# Patient Record
Sex: Female | Born: 1945
Health system: Southern US, Community
[De-identification: ages and names within clinical notes are randomized; demographics above are authoritative.]

## PROBLEM LIST (undated history)

## (undated) DIAGNOSIS — B019 Varicella without complication: Secondary | ICD-10-CM

## (undated) DIAGNOSIS — A599 Trichomoniasis, unspecified: Secondary | ICD-10-CM

## (undated) DIAGNOSIS — J45909 Unspecified asthma, uncomplicated: Secondary | ICD-10-CM

## (undated) DIAGNOSIS — I10 Essential (primary) hypertension: Secondary | ICD-10-CM

## (undated) DIAGNOSIS — M1990 Unspecified osteoarthritis, unspecified site: Secondary | ICD-10-CM

## (undated) DIAGNOSIS — T4145XA Adverse effect of unspecified anesthetic, initial encounter: Secondary | ICD-10-CM

## (undated) DIAGNOSIS — R011 Cardiac murmur, unspecified: Secondary | ICD-10-CM

## (undated) DIAGNOSIS — T7840XA Allergy, unspecified, initial encounter: Secondary | ICD-10-CM

## (undated) DIAGNOSIS — B379 Candidiasis, unspecified: Secondary | ICD-10-CM

## (undated) DIAGNOSIS — T8859XA Other complications of anesthesia, initial encounter: Secondary | ICD-10-CM

## (undated) HISTORY — PX: OTHER SURGICAL HISTORY: SHX169

## (undated) HISTORY — PX: COLONOSCOPY: SHX174

## (undated) HISTORY — PX: BUNIONECTOMY: SHX129

## (undated) HISTORY — DX: Essential (primary) hypertension: I10

## (undated) HISTORY — DX: Unspecified asthma, uncomplicated: J45.909

## (undated) HISTORY — PX: BREAST LUMPECTOMY: SHX2

## (undated) HISTORY — DX: Candidiasis, unspecified: B37.9

## (undated) HISTORY — DX: Allergy, unspecified, initial encounter: T78.40XA

## (undated) HISTORY — DX: Varicella without complication: B01.9

## (undated) HISTORY — DX: Trichomoniasis, unspecified: A59.9

---

## 2001-04-23 ENCOUNTER — Other Ambulatory Visit: Admission: RE | Admit: 2001-04-23 | Discharge: 2001-04-23 | Payer: Self-pay | Admitting: *Deleted

## 2001-12-04 ENCOUNTER — Ambulatory Visit (HOSPITAL_COMMUNITY): Admission: RE | Admit: 2001-12-04 | Discharge: 2001-12-04 | Payer: Self-pay | Admitting: Gastroenterology

## 2002-03-11 ENCOUNTER — Encounter: Admission: RE | Admit: 2002-03-11 | Discharge: 2002-06-09 | Payer: Self-pay | Admitting: Family Medicine

## 2002-05-17 ENCOUNTER — Other Ambulatory Visit: Admission: RE | Admit: 2002-05-17 | Discharge: 2002-05-17 | Payer: Self-pay | Admitting: Obstetrics and Gynecology

## 2003-07-01 ENCOUNTER — Other Ambulatory Visit: Admission: RE | Admit: 2003-07-01 | Discharge: 2003-07-01 | Payer: Self-pay | Admitting: Obstetrics and Gynecology

## 2004-07-18 ENCOUNTER — Other Ambulatory Visit: Admission: RE | Admit: 2004-07-18 | Discharge: 2004-07-18 | Payer: Self-pay | Admitting: Obstetrics and Gynecology

## 2005-09-12 ENCOUNTER — Other Ambulatory Visit: Admission: RE | Admit: 2005-09-12 | Discharge: 2005-09-12 | Payer: Self-pay | Admitting: Obstetrics and Gynecology

## 2007-06-23 ENCOUNTER — Encounter: Admission: RE | Admit: 2007-06-23 | Discharge: 2007-06-23 | Payer: Self-pay | Admitting: Orthopedic Surgery

## 2007-09-15 ENCOUNTER — Encounter: Admission: RE | Admit: 2007-09-15 | Discharge: 2007-09-15 | Payer: Self-pay | Admitting: Orthopedic Surgery

## 2007-10-21 ENCOUNTER — Observation Stay (HOSPITAL_COMMUNITY): Admission: RE | Admit: 2007-10-21 | Discharge: 2007-10-22 | Payer: Self-pay | Admitting: Orthopedic Surgery

## 2007-11-20 ENCOUNTER — Encounter: Admission: RE | Admit: 2007-11-20 | Discharge: 2007-11-20 | Payer: Self-pay | Admitting: Orthopedic Surgery

## 2007-11-26 ENCOUNTER — Encounter: Admission: RE | Admit: 2007-11-26 | Discharge: 2007-12-28 | Payer: Self-pay | Admitting: Orthopedic Surgery

## 2008-04-14 ENCOUNTER — Encounter: Admission: RE | Admit: 2008-04-14 | Discharge: 2008-04-14 | Payer: Self-pay | Admitting: Orthopedic Surgery

## 2008-06-20 ENCOUNTER — Encounter: Admission: RE | Admit: 2008-06-20 | Discharge: 2008-06-20 | Payer: Self-pay | Admitting: Family Medicine

## 2008-07-15 ENCOUNTER — Ambulatory Visit: Payer: Self-pay | Admitting: Vascular Surgery

## 2008-10-24 IMAGING — CR DG FOOT COMPLETE 3+V*L*
3 series · 3 of 3 positions shown · non-contrast
Comparison: Films of 09/15/07.

CLINICAL DATA: Bunionectomy left foot 10/22/07.  Followup. 
 LEFT FOOT ? 3 VIEW:

[view not recorded (1 of 3)]
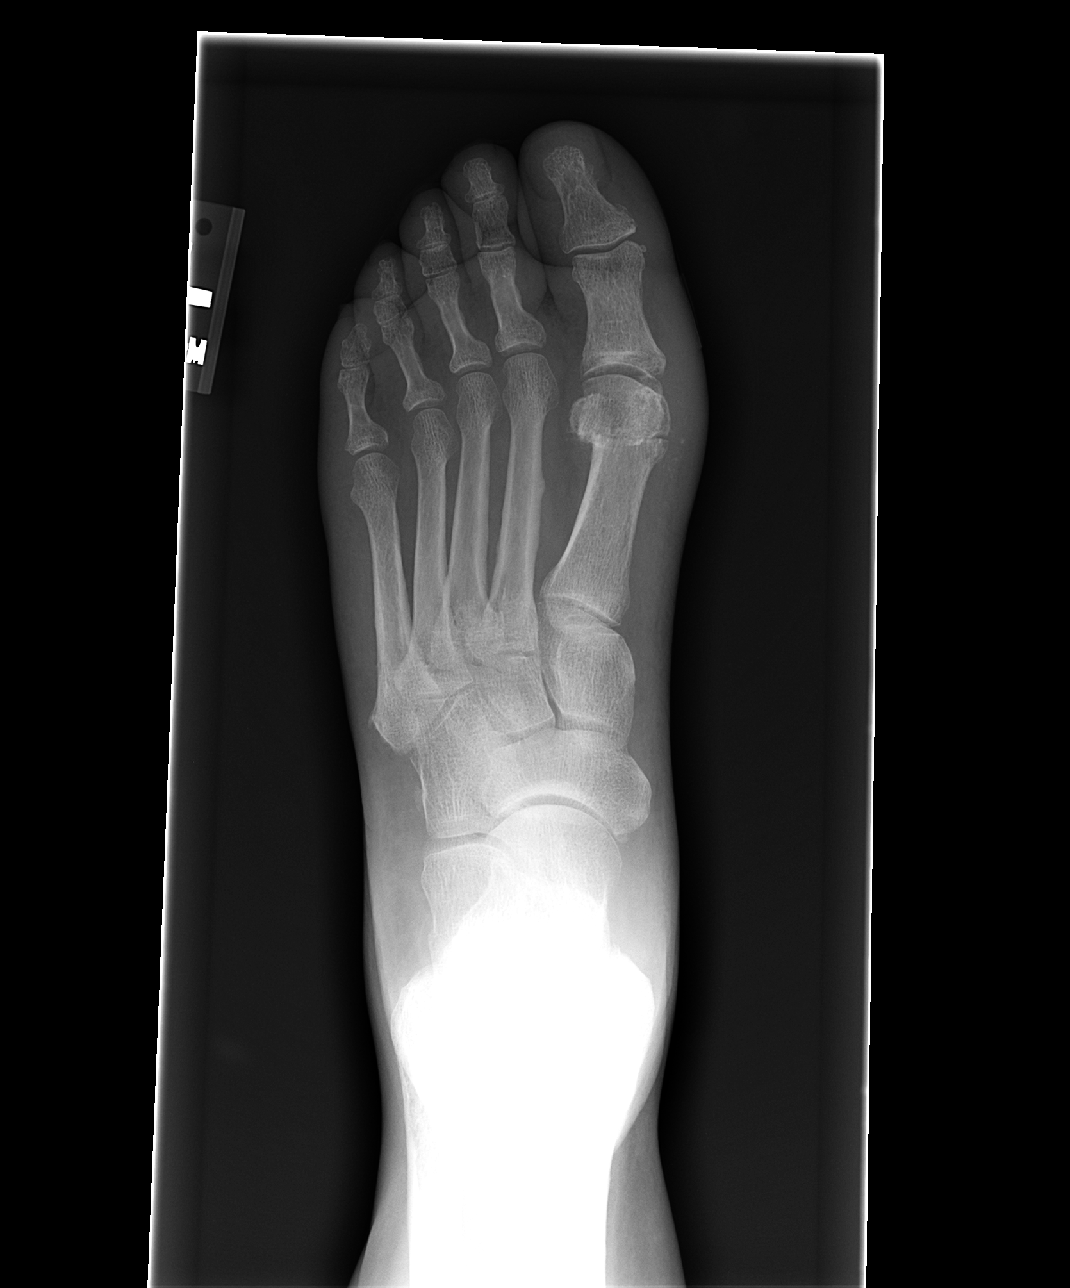

[view not recorded (2 of 3)]
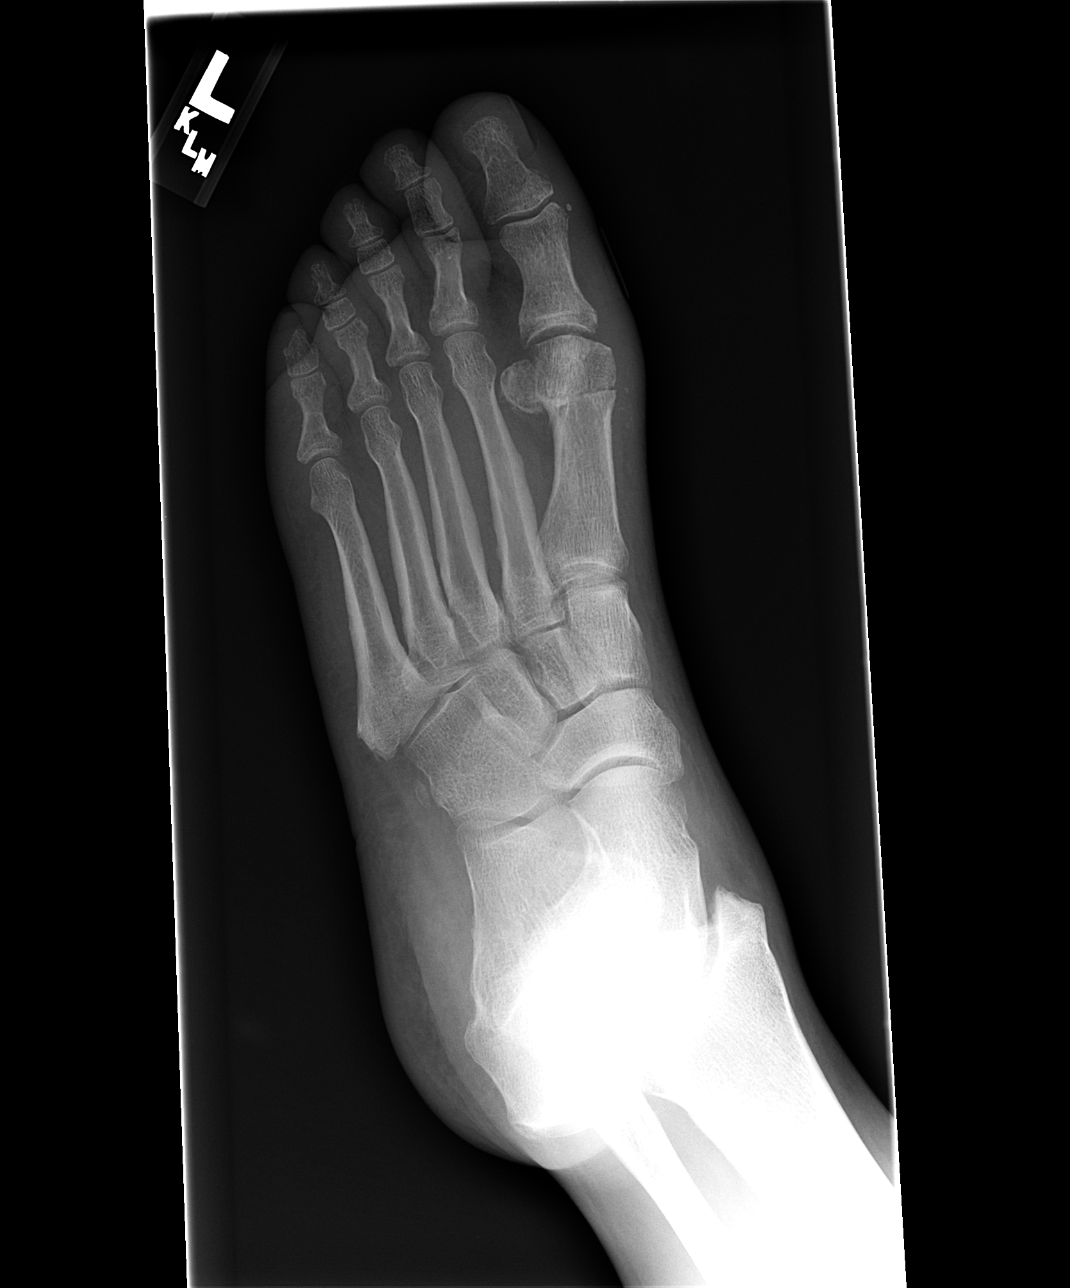

[view not recorded (3 of 3)]
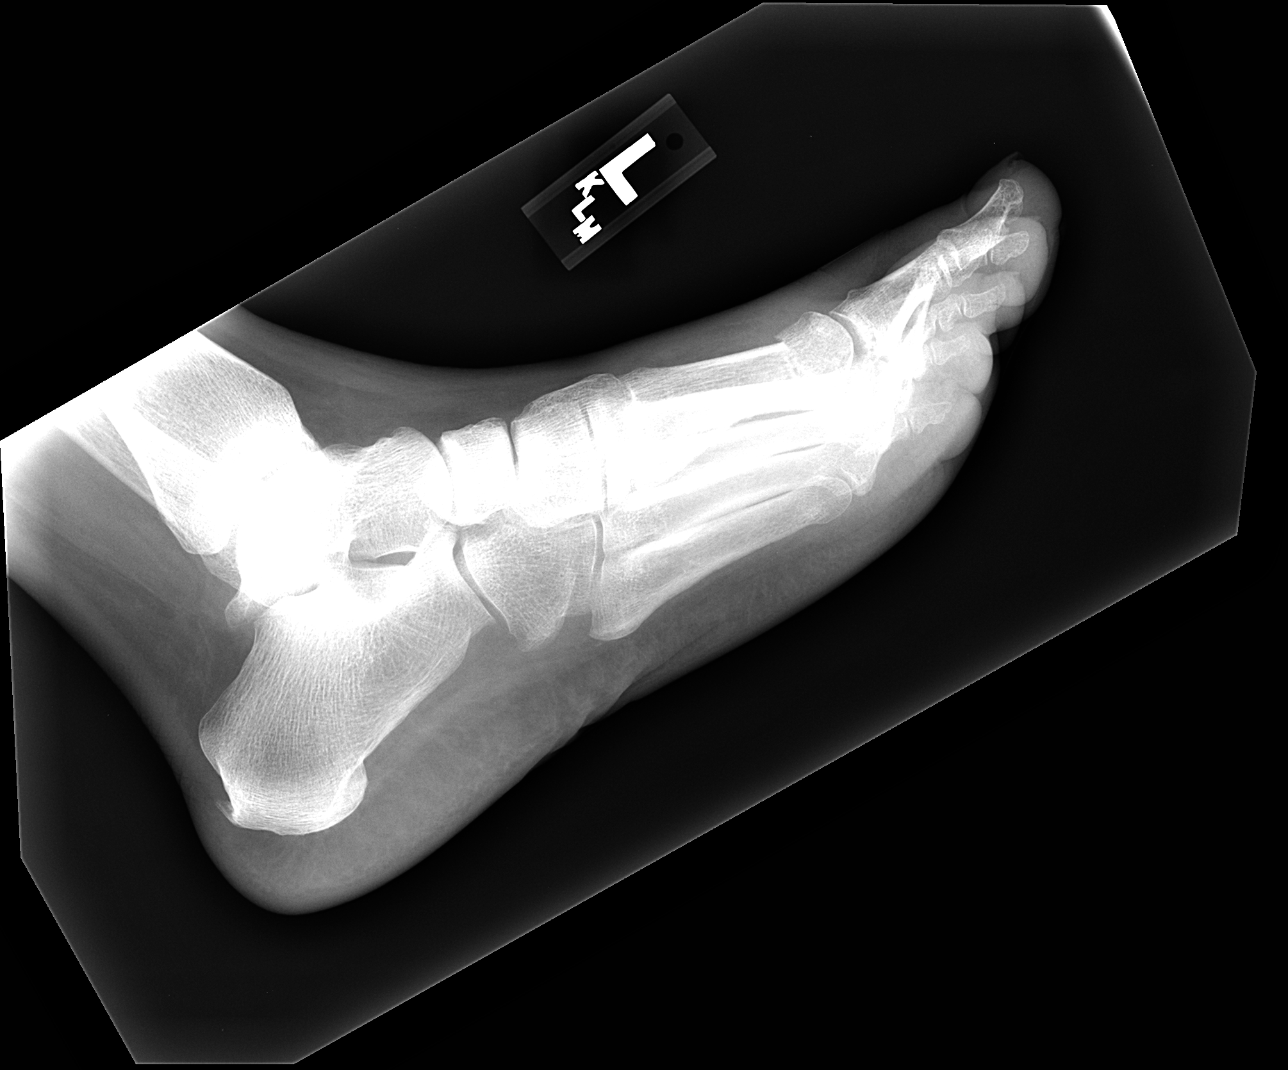

[3 of 3 positions shown; findings below may reference images not displayed]

FINDINGS: Three views of the left foot show an apparent osteotomy of the distal left 1st metatarsal.  The osteotomy line is still visualized and there is no significant callus formation.  No fixation pin is present.  On the lateral view there is some plantar tilting at the osteotomy site.  In the adjacent soft tissues medially there are small calcifications that are not seen previously and may be dystrophic.  No other abnormality is noted.
IMPRESSION: Apparent osteotomy of the distal left 1st metatarsal with some plantar tilting at the osteotomy site.  No acute abnormality.

## 2010-12-27 ENCOUNTER — Ambulatory Visit: Payer: No Typology Code available for payment source | Attending: Orthopedic Surgery | Admitting: Physical Therapy

## 2010-12-27 DIAGNOSIS — M542 Cervicalgia: Secondary | ICD-10-CM | POA: Insufficient documentation

## 2010-12-27 DIAGNOSIS — M545 Low back pain, unspecified: Secondary | ICD-10-CM | POA: Insufficient documentation

## 2010-12-27 DIAGNOSIS — IMO0001 Reserved for inherently not codable concepts without codable children: Secondary | ICD-10-CM | POA: Insufficient documentation

## 2011-01-01 ENCOUNTER — Ambulatory Visit: Payer: No Typology Code available for payment source | Admitting: Physical Therapy

## 2011-01-03 ENCOUNTER — Ambulatory Visit: Payer: No Typology Code available for payment source | Admitting: Physical Therapy

## 2011-01-04 ENCOUNTER — Ambulatory Visit: Payer: No Typology Code available for payment source | Admitting: Physical Therapy

## 2011-01-07 ENCOUNTER — Ambulatory Visit: Payer: No Typology Code available for payment source | Admitting: Physical Therapy

## 2011-01-09 ENCOUNTER — Ambulatory Visit: Payer: No Typology Code available for payment source | Admitting: Physical Therapy

## 2011-01-11 ENCOUNTER — Ambulatory Visit: Payer: No Typology Code available for payment source | Admitting: Physical Therapy

## 2011-01-15 ENCOUNTER — Ambulatory Visit: Payer: No Typology Code available for payment source | Admitting: Physical Therapy

## 2011-01-17 ENCOUNTER — Ambulatory Visit: Payer: No Typology Code available for payment source | Attending: Orthopedic Surgery | Admitting: Physical Therapy

## 2011-01-17 DIAGNOSIS — IMO0001 Reserved for inherently not codable concepts without codable children: Secondary | ICD-10-CM | POA: Insufficient documentation

## 2011-01-17 DIAGNOSIS — M542 Cervicalgia: Secondary | ICD-10-CM | POA: Insufficient documentation

## 2011-01-17 DIAGNOSIS — M545 Low back pain, unspecified: Secondary | ICD-10-CM | POA: Insufficient documentation

## 2011-01-22 ENCOUNTER — Ambulatory Visit: Payer: No Typology Code available for payment source | Admitting: Physical Therapy

## 2011-01-24 ENCOUNTER — Ambulatory Visit: Payer: No Typology Code available for payment source | Admitting: Physical Therapy

## 2011-01-29 ENCOUNTER — Ambulatory Visit: Payer: No Typology Code available for payment source | Admitting: Physical Therapy

## 2011-02-04 ENCOUNTER — Ambulatory Visit: Payer: No Typology Code available for payment source | Admitting: Physical Therapy

## 2011-02-06 ENCOUNTER — Ambulatory Visit: Payer: No Typology Code available for payment source | Admitting: Physical Therapy

## 2011-02-11 ENCOUNTER — Ambulatory Visit: Payer: No Typology Code available for payment source | Admitting: Physical Therapy

## 2011-02-14 ENCOUNTER — Ambulatory Visit: Payer: No Typology Code available for payment source | Admitting: Physical Therapy

## 2011-02-18 ENCOUNTER — Ambulatory Visit: Payer: No Typology Code available for payment source | Attending: Orthopedic Surgery | Admitting: Physical Therapy

## 2011-02-18 DIAGNOSIS — IMO0001 Reserved for inherently not codable concepts without codable children: Secondary | ICD-10-CM | POA: Insufficient documentation

## 2011-02-18 DIAGNOSIS — M542 Cervicalgia: Secondary | ICD-10-CM | POA: Insufficient documentation

## 2011-02-18 DIAGNOSIS — M545 Low back pain, unspecified: Secondary | ICD-10-CM | POA: Insufficient documentation

## 2011-02-20 ENCOUNTER — Ambulatory Visit: Payer: No Typology Code available for payment source | Admitting: Physical Therapy

## 2011-02-25 ENCOUNTER — Other Ambulatory Visit: Payer: Self-pay | Admitting: Orthopedic Surgery

## 2011-02-25 ENCOUNTER — Ambulatory Visit: Payer: No Typology Code available for payment source | Admitting: Physical Therapy

## 2011-02-25 DIAGNOSIS — G8929 Other chronic pain: Secondary | ICD-10-CM

## 2011-02-27 ENCOUNTER — Ambulatory Visit: Payer: No Typology Code available for payment source | Admitting: Physical Therapy

## 2011-02-28 ENCOUNTER — Ambulatory Visit
Admission: RE | Admit: 2011-02-28 | Discharge: 2011-02-28 | Disposition: A | Discharge status: Home / Self Care | Payer: BC Managed Care – PPO | Source: Ambulatory Visit | Attending: Orthopedic Surgery | Admitting: Orthopedic Surgery

## 2011-02-28 DIAGNOSIS — M542 Cervicalgia: Secondary | ICD-10-CM

## 2011-02-28 DIAGNOSIS — G8929 Other chronic pain: Secondary | ICD-10-CM

## 2011-03-04 ENCOUNTER — Ambulatory Visit: Payer: No Typology Code available for payment source | Admitting: Physical Therapy

## 2011-03-06 ENCOUNTER — Ambulatory Visit: Payer: No Typology Code available for payment source | Admitting: Physical Therapy

## 2011-03-11 ENCOUNTER — Ambulatory Visit: Payer: No Typology Code available for payment source | Admitting: Physical Therapy

## 2011-03-14 ENCOUNTER — Ambulatory Visit: Payer: No Typology Code available for payment source | Admitting: Physical Therapy

## 2011-03-18 ENCOUNTER — Ambulatory Visit: Payer: No Typology Code available for payment source | Admitting: Physical Therapy

## 2011-03-20 ENCOUNTER — Ambulatory Visit: Payer: BC Managed Care – PPO | Attending: Orthopedic Surgery | Admitting: Physical Therapy

## 2011-03-20 DIAGNOSIS — IMO0001 Reserved for inherently not codable concepts without codable children: Secondary | ICD-10-CM | POA: Insufficient documentation

## 2011-03-20 DIAGNOSIS — M545 Low back pain, unspecified: Secondary | ICD-10-CM | POA: Insufficient documentation

## 2011-03-20 DIAGNOSIS — M542 Cervicalgia: Secondary | ICD-10-CM | POA: Insufficient documentation

## 2011-03-25 ENCOUNTER — Ambulatory Visit: Payer: BC Managed Care – PPO | Admitting: Physical Therapy

## 2011-03-27 ENCOUNTER — Ambulatory Visit: Payer: BC Managed Care – PPO | Admitting: Physical Therapy

## 2011-04-02 NOTE — Op Note (Signed)
NAMECHISTINA, ROSTON NO.:  [id number]   MEDICAL RECORD NO.:  [id number]          PATIENT TYPE:  OIB   LOCATION:  2550                         FACILITY:  [facility]   PHYSICIAN:  Myrtie Neither, MD      DATE OF BIRTH:  Oct 04, 1946   DATE OF PROCEDURE:  10/21/2007  DATE OF DISCHARGE:                               OPERATIVE REPORT   PREOPERATIVE DIAGNOSIS:  Hallux valgus bunion left foot.   POSTOPERATIVE DIAGNOSIS:  Hallux valgus bunion left foot.   ANESTHESIA:  General.   PROCEDURE:  Mitchell's bunionectomy, osteotomy, first metatarsal  fixation with K-wire.   PROCEDURE IN DETAIL:  The patient was taken to the operating room and  after given adequate preop medications given general anesthesia,  intubated, left foot was prepped with pHisoHex and draped in a sterile  manner.  Tourniquet and Bovie used for hemostasis.  Curvilinear incision  made over the left great joint going through the skin and subcutaneous  tissue.  Sharp and blunt dissection was made down to the capsule.  Incision made in the capsule.  Capsule reflected distally.  Bunion was  exposed.  With an oscillating dental saw the bunion was resected.  Edges  were smoothed down.  Next distal metatarsal osteotomy was then done of  the first metatarsal sliding the distal fragment laterally.  Stabilized  with a K-wire which brought the correct hallux valgus.  Copious  irrigation was done.  Capsule was closed.  Capsule was closed with zero  Vicryl then a subcuticular Dermabond closure was then done.  Compressive  dressing was applied.  The K-wire was cut exterior to the skin and bent  and placement of a pin cap.  Compressive dressing was applied and  followed by use of a Cam walker.  The patient tolerated the procedure  quite well and was taken to the recovery room in stable and satisfactory  condition.  The patient is being kept for 23 hour observation for pain  control, ice packs and toe-touch weightbearing on  the left side.  The  patient will be discharged on Percocet one to two q. 4 p.r.n. for pain  and to return to the office in 1 week.  The patient is being discharged  in stable and satisfactory condition.      Myrtie Neither, MD  Electronically Signed     AC/MEDQ  D:  10/21/2007  T:  10/21/2007  Job:  [id number]

## 2011-04-02 NOTE — Consult Note (Signed)
NEW PATIENT CONSULTATION   CHINAZA, ROOKE  DOB:  Jan 09, 1946                                       07/15/2008  EAVWU#:[id number]   The patient presents today for evaluation of new onset of bilateral  swelling.  She is an active healthy 65 year old female with a history of  non-insulin diabetes and hypertension.  She had, for the past, for the  past month bilateral lower extremity swelling.  She reports that she had  had treatment with steroids and Celebrex for a shoulder discomfort.  Felt that it might be related to this and this has now been  discontinued.  She has had persistent edema.  The edema completely  resolves overnight and then recurs quickly and she is up in the morning.  She has no pain associated with this.  She does not have any history of  deep venous thrombosis.  She is otherwise very healthy.   FAMILY HISTORY:  Significant for arterial insufficiency in her mother  with left hip eventual resulting bilateral amputations.   SOCIAL HISTORY:  She is married with 3 children.  Works as an  Production designer, theatre/television/film.  She does not smoke or drink alcohol.   REVIEW OF SYSTEMS:  Her weight is reported 190 pounds.  She is 5 feet  1/2 inch tall.  Her review of systems is positive only for arthritic joint pain in her  left shoulder.   MEDICATION ALLERGIES:  Demerol, codeine, Betadine.   CURRENT MEDICATIONS:  Cardizem, Diovan, imipramine, metformin, Clarinex  Nasonex, p.r.n. Celebrex.   PHYSICAL EXAMINATION:  Well-developed, well-nourished black female  appearing stated age 82.  Blood pressure is 147/70, pulse 72,  respirations 18.  Her dorsalis pedis pulses 2+ bilaterally.  She does  have a moderate peak pretibial pitting edema.  She does not have any  edema down onto her foot and toes.  She does not have any evidence of  superficial varicosities.  She did undergo a formal venous duplex at  [facility], and I have results of the showing no evidence  of  obstruction and normal phasicity in her veins bilaterally.  There is no  specific comment of her superficial system.  I did that image her  superficial saphenous system and this shows a wide patency with no  dilatation and no reflux.  I discussed this with the patient and I feel  that time she does not have any arterial venous pathology to explain the  swelling.  Explained in all likelihood, this will be a persistent  nuisance and treatment would be elevation when possible, diuretic  treatment, and if it progresses she could consider support stockings.  She was reassured with this discussion and will see [place] again on an as-  needed basis.   Larina Earthly, M.D.  Electronically Signed   TFE/MEDQ  D:  07/15/2008  T:  07/18/2008  Job:  [id number]   cc:   Renaye Rakers, M.D.  Lyn Records, M.D.

## 2011-04-05 NOTE — Procedures (Signed)
[place]  Patient:    [name], Cheryl Good Visit Number: [id number] MRN: [id number]          Service Type: END Location: ENDO Attending Physician:  Charna Elizabeth Dictated by:   Anselmo Rod, M.D. Proc. Date: 12/04/01 Admit Date:  12/04/2001 Discharge Date: 12/04/2001                             Procedure Report  DATE OF BIRTH:  08-Nov-1946.  PROCEDURE:  Colonoscopy.  ENDOSCOPIST:  Anselmo Rod, M.D.  INSTRUMENTS:  Olympus video colonoscope.  INDICATION:  A 65 year old African-American female with a history of colon cancer in the family.  Rule out colonic polyps, masses, hemorrhoids.  INFORMED CONSENT:  Informed consent was procured from the patient.  PREPARATION:  The patient was fasted for eight hours prior to the procedure and prepped with a bottle of magnesium citrate and a gallon of NuLYTELY the night prior to the procedure.  PHYSICAL EXAMINATION:  VITAL SIGNS:  Stable.  NECK:  Supple.  CHEST:  Clear to auscultation.  HEART:  S1 and S2 regular.  ABDOMEN:  Soft with normal bowel sounds.  DESCRIPTION OF PROCEDURE:  The patient was placed in the left lateral decubitus position and sedated with 50 mg of Demerol and 7 mg of Versed intravenously.  Once the patient was adequately sedated and maintained on low flow oxygen and continuous cardiac monitoring, the Olympus video colonoscope was advanced from the rectum to the cecum with difficulty initially.  The pediatric colonoscope was used and this had to be changed to an adult colonoscope because of inability to reach the cecum.  The patients position was changed from the left lateral to the supine position and the right lateral position to adequately visualize the cecal base.  There was a large amount of stool in the right colon.  Multiple washes were done.  Small lesions could have been missed.  No masses, polyps, erosions, or ulcerations were noted.  There was  evidence of scattered diverticular disease.  Small nonbleeding internal hemorrhoids were noted on retroflexion in the rectum.  The patient tolerated the procedure well without complications.  IMPRESSION: 1. A few scattered diverticular. 2. Small nonbleeding internal hemorrhoids. 3. Large amount of residual stool, especially in the right colon.  Small    lesions could have been missed. 4. No evidence of masses or polyps.  RECOMMENDATIONS:  Repeat colorectal cancer screening is recommended in the next five years unless the patient develops any abnormal symptoms in the interim.  High fiber diet has been recommended.  Outpatient follow-up on a p.r.n. basis. Dictated by:   Anselmo Rod, M.D. Attending Physician:  Charna Elizabeth DD:  12/04/01 TD:  12/06/01 Job: [id number]

## 2011-04-09 ENCOUNTER — Ambulatory Visit: Payer: BC Managed Care – PPO | Admitting: Physical Therapy

## 2011-04-11 ENCOUNTER — Ambulatory Visit: Payer: BC Managed Care – PPO | Admitting: Physical Therapy

## 2011-04-17 ENCOUNTER — Ambulatory Visit: Payer: BC Managed Care – PPO | Admitting: Physical Therapy

## 2011-04-19 ENCOUNTER — Ambulatory Visit: Payer: No Typology Code available for payment source | Attending: Orthopedic Surgery | Admitting: Physical Therapy

## 2011-04-19 DIAGNOSIS — M545 Low back pain, unspecified: Secondary | ICD-10-CM | POA: Insufficient documentation

## 2011-04-19 DIAGNOSIS — M542 Cervicalgia: Secondary | ICD-10-CM | POA: Insufficient documentation

## 2011-04-19 DIAGNOSIS — IMO0001 Reserved for inherently not codable concepts without codable children: Secondary | ICD-10-CM | POA: Insufficient documentation

## 2011-08-26 LAB — URINALYSIS, ROUTINE W REFLEX MICROSCOPIC
Bilirubin Urine: NEGATIVE
Glucose, UA: NEGATIVE
Hgb urine dipstick: NEGATIVE
Ketones, ur: NEGATIVE
Nitrite: NEGATIVE
Protein, ur: NEGATIVE
Specific Gravity, Urine: 1.023
Urobilinogen, UA: 0.2
pH: 6

## 2011-08-26 LAB — COMPREHENSIVE METABOLIC PANEL
ALT: 20
AST: 26
Albumin: 4.2
Alkaline Phosphatase: 68
BUN: 17
CO2: 30
Calcium: 9.6
Chloride: 104
Creatinine, Ser: 0.95
GFR calc Af Amer: >60
GFR calc non Af Amer: 60 — ABNORMAL LOW
Glucose, Bld: 93
Potassium: 3.7
Sodium: 142
Total Bilirubin: 0.7
Total Protein: 7

## 2011-08-26 LAB — PROTIME-INR
INR: 0.9
Prothrombin Time: 12.7

## 2011-08-26 LAB — CBC
HCT: 40.7
Hemoglobin: 13.7
MCHC: 33.7
MCV: 84.8
Platelets: 249
RBC: 4.8
RDW: 13.9
WBC: 6.4

## 2011-08-26 LAB — POCT I-STAT 4, (NA,K, GLUC, HGB,HCT)
Glucose, Bld: 105 — ABNORMAL HIGH
HCT: 39
Hemoglobin: 13.3
Operator id: 138551
Potassium: 3.4 — ABNORMAL LOW
Sodium: 140

## 2011-08-26 LAB — APTT: aPTT: 29

## 2012-01-28 DIAGNOSIS — N952 Postmenopausal atrophic vaginitis: Secondary | ICD-10-CM | POA: Insufficient documentation

## 2012-02-26 ENCOUNTER — Encounter: Payer: Self-pay | Admitting: Obstetrics and Gynecology

## 2012-02-26 ENCOUNTER — Ambulatory Visit (INDEPENDENT_AMBULATORY_CARE_PROVIDER_SITE_OTHER): Payer: Medicare Other | Admitting: Obstetrics and Gynecology

## 2012-02-26 ENCOUNTER — Ambulatory Visit: Payer: Self-pay | Admitting: Obstetrics and Gynecology

## 2012-02-26 VITALS — BP 114/60 | HR 84 | Temp 98.8°F | Resp 16 | Ht 61.0 in | Wt 179.0 lb

## 2012-02-26 DIAGNOSIS — Z124 Encounter for screening for malignant neoplasm of cervix: Secondary | ICD-10-CM

## 2012-02-26 DIAGNOSIS — N930 Postcoital and contact bleeding: Secondary | ICD-10-CM

## 2012-02-26 MED ORDER — NONFORMULARY OR COMPOUNDED ITEM: Status: DC

## 2012-02-28 LAB — PAP IG AND HPV HIGH-RISK: HPV DNA High Risk: NOT DETECTED

## 2012-03-02 ENCOUNTER — Encounter: Payer: Self-pay | Admitting: Obstetrics and Gynecology

## 2012-03-02 ENCOUNTER — Telehealth: Payer: Self-pay

## 2012-03-02 MED ORDER — ESTRADIOL 25 MCG VA TABS: ORAL_TABLET | VAGINAL | Status: DC

## 2012-03-02 NOTE — Telephone Encounter (Signed)
PC TO PT TO SCHED 6WK FOLLOW-UP [facility]G MEDS PER [facility]. LM ON VM FOR PT TO CB.

## 2012-03-02 NOTE — Progress Notes (Signed)
Subjective:    Cheryl Good is a 66 y.o. female No obstetric history on file. who presents for annual exam.  The patient complains of continued post coital bleeding. She has used Vagifem in the past, but not consistently  The following portions of the patient's history were reviewed and updated as appropriate: allergies, current medications, past family history, past medical history, past social history, past surgical history and problem list.  Review of Systems Pertinent items are noted in HPI. Gastrointestinal:No change in bowel habits, no abdominal pain, no rectal bleeding Genitourinary:negative for dysuria, frequency, hematuria, nocturia and urinary incontinence    Objective:     BP 114/60  Pulse 84  Temp 98.8 F (37.1 C)  Resp 16  Ht 5\' 1"  (1.549 m)  Wt 179 lb (81.194 kg)  BMI 33.82 kg/m2  Weight:  Wt Readings from Last 1 Encounters:  02/26/12 179 lb (81.194 kg)     BMI: Body mass index is 33.82 kg/(m^2). General Appearance: Alert, appropriate appearance for age. No acute distress HEENT: Grossly normal Neck / Thyroid: Supple, no masses, nodes or enlargement Lungs: clear to auscultation bilaterally Back: No CVA tenderness Breast Exam: No masses or nodes.No dimpling, nipple retraction or discharge. Cardiovascular: Regular rate and rhythm. S1, S2, no murmur Gastrointestinal: Soft, non-tender, no masses or organomegaly Pelvic Exam: External genitalia: normal general appearance Vaginal: atrophic mucosa and inflamed mucosa Cervix: slight atrophy Adnexa: no palpable masses Uterus: doesnt feel enlarged Rectal: no masses Exam limited by body habitus Rectovaginal: normal rectal, no masses Lymphatic Exam: Non-palpable nodes in neck, clavicular, axillary, or inguinal regions Skin: no rash or abnormalities Neurologic: Normal gait and speech, no tremor  Psychiatric: Alert and oriented, appropriate affect.    Urinalysis:Not done      Assessment:    Post-menopausal  vaginal bleeding Post coital bleeding only.  Most likely due to inflammation and atrophy.  Excellent wt loss with behavior modification   Plan:    All questions answered. Await pap smear results. Discussed healthy lifestyle modifications. Pap smear.  Continue Vagifem 2x weekly Vaginal Ketoprofen for inflammation. Written script given as pt's pharmacy does not e-prescribe. Consider FU in 6 wks if no improvement  Follow-up:  for annual exam or if no improvement

## 2012-03-02 NOTE — Telephone Encounter (Signed)
TC FROM PT. APPT SCHED 04-15-12@10 :15A WITH [facility] TO FOLLOW UP MEDS. PT VOICES UNDERSTANDING.

## 2012-03-02 NOTE — Telephone Encounter (Signed)
This encounter was created in error - please disregard.

## 2012-03-02 NOTE — Telephone Encounter (Deleted)
COMPLETE

## 2012-03-20 ENCOUNTER — Other Ambulatory Visit: Payer: Self-pay | Admitting: Family Medicine

## 2012-03-20 DIAGNOSIS — M79604 Pain in right leg: Secondary | ICD-10-CM

## 2012-03-26 ENCOUNTER — Ambulatory Visit
Admission: RE | Admit: 2012-03-26 | Discharge: 2012-03-26 | Disposition: A | Discharge status: Home / Self Care | Payer: BC Managed Care – PPO | Source: Ambulatory Visit | Attending: Family Medicine | Admitting: Family Medicine

## 2012-03-26 DIAGNOSIS — M79604 Pain in right leg: Secondary | ICD-10-CM

## 2012-04-15 ENCOUNTER — Encounter: Payer: Self-pay | Admitting: Obstetrics and Gynecology

## 2012-04-15 ENCOUNTER — Ambulatory Visit (INDEPENDENT_AMBULATORY_CARE_PROVIDER_SITE_OTHER): Payer: Medicare Other | Admitting: Obstetrics and Gynecology

## 2012-04-15 VITALS — BP 130/72 | HR 60 | Ht 61.0 in | Wt 187.0 lb

## 2012-04-15 DIAGNOSIS — N930 Postcoital and contact bleeding: Secondary | ICD-10-CM

## 2012-04-15 DIAGNOSIS — N952 Postmenopausal atrophic vaginitis: Secondary | ICD-10-CM

## 2012-04-15 MED ORDER — ESTRADIOL 10 MCG VA TABS: 1.0000 | ORAL_TABLET | VAGINAL | Status: DC

## 2012-04-15 MED ORDER — ESTRADIOL 25 MCG VA TABS: ORAL_TABLET | VAGINAL | Status: DC

## 2012-04-15 NOTE — Progress Notes (Signed)
F/U ATROPHIC VAGINITIS No further postcoital bleeding.  Intercourse more comfortable  EXAM:  BP 130/72  Pulse 60  Ht 5\' 1"  (1.549 m)  Wt 187 lb (84.823 kg)  BMI 35.33 kg/m2 Pelvic:  EGUS: Nl  Vagina:  No excoriated areas, rougous  IMPRESSION: Atrophic vaginitis, improved  RECOMMENDATION: Continue Vagifem 2x/ wk for another 6 wks, then 1x/wk thereafter Discontinue Ketoprofen vaginal prep RTO for aex 4/14

## 2013-06-17 ENCOUNTER — Other Ambulatory Visit: Payer: Self-pay | Admitting: Orthopedic Surgery

## 2013-06-17 ENCOUNTER — Other Ambulatory Visit: Payer: Self-pay | Admitting: Ophthalmology

## 2013-06-17 ENCOUNTER — Ambulatory Visit
Admission: RE | Admit: 2013-06-17 | Discharge: 2013-06-17 | Disposition: A | Discharge status: Home / Self Care | Payer: Medicare PPO | Source: Ambulatory Visit | Attending: Orthopedic Surgery | Admitting: Orthopedic Surgery

## 2013-06-17 DIAGNOSIS — M659 Synovitis and tenosynovitis, unspecified: Secondary | ICD-10-CM

## 2013-06-17 DIAGNOSIS — M7120 Synovial cyst of popliteal space [Baker], unspecified knee: Secondary | ICD-10-CM

## 2014-02-19 ENCOUNTER — Encounter: Payer: Self-pay | Admitting: *Deleted

## 2014-09-19 ENCOUNTER — Encounter: Payer: Self-pay | Admitting: *Deleted

## 2016-07-16 ENCOUNTER — Encounter: Payer: Self-pay | Admitting: Cardiology

## 2016-07-17 ENCOUNTER — Telehealth: Payer: Self-pay

## 2016-07-17 NOTE — Telephone Encounter (Signed)
Cardiac clearance placed in MR nurse fax box to be faxed to [facility]

## 2016-07-18 ENCOUNTER — Encounter: Payer: Self-pay | Admitting: Cardiology

## 2016-07-18 ENCOUNTER — Encounter (INDEPENDENT_AMBULATORY_CARE_PROVIDER_SITE_OTHER): Payer: Self-pay

## 2016-07-18 ENCOUNTER — Ambulatory Visit (INDEPENDENT_AMBULATORY_CARE_PROVIDER_SITE_OTHER): Payer: Medicare Other | Admitting: Cardiology

## 2016-07-18 VITALS — BP 118/68 | HR 60 | Ht 61.0 in | Wt 180.1 lb

## 2016-07-18 DIAGNOSIS — E089 Diabetes mellitus due to underlying condition without complications: Secondary | ICD-10-CM | POA: Diagnosis not present

## 2016-07-18 DIAGNOSIS — Z01810 Encounter for preprocedural cardiovascular examination: Secondary | ICD-10-CM

## 2016-07-18 NOTE — Patient Instructions (Signed)
Medication Instructions:  Your physician recommends that you continue on your current medications as directed. Please refer to the Current Medication list given to you today.   Follow-Up: Follow-up with Dr. Marlou Porch as needed.

## 2016-07-18 NOTE — Progress Notes (Signed)
Cardiology Office Note    Date:  07/18/2016   ID:  VERMA JASSO, DOB 1946-02-21, MRN [id number]  PCP:  Elyn Peers, MD  Cardiologist:  Candee Furbish, MD     History of Present Illness:  Cheryl Good is a 70 y.o. female here for preoperative risk evaluation prior to orthopedic surgery at the request of Dr. Vickey Huger.  In review of orthopedic office notes from 04/08/16 she has been describing knee pain bilaterally. 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 She would like to start with the right knee replacement in the fall. She has had injections in the past.  Dr. Tamala Julian many years ago saw her, heart murmur. Stress test was fine (about 3-4 years ago).   She has not experienced any chest pain, shortness of breath, fevers, chills, syncope, adverse arrhythmias. Has allergies. 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 She has been seen in the past by vascular, Dr. Donnetta Hutching who did not believe that any of her edema was related to any significant venous pathology. Recommended elevation, stockings.   She was very Patent attorney of our office staff, especially Thomes Dinning who helped her out on the phone.  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 She also states that she does not tolerate Ester related anesthetics   Past Medical History:  Diagnosis Date  . Allergy   . Chicken pox   . Diabetes mellitus   . Hypertension   . Measles   . Mumps   . Rubella   . Trichomonas   . Yeast infection     Past Surgical History:  Procedure Laterality Date  . BREAST LUMPECTOMY    . orthoscopic knee surgery      Current Medications: Outpatient Medications Prior to Visit  Medication Sig Dispense Refill  . calcium carbonate 200 MG capsule Take by mouth daily. Doesn't know dosage    . diltiazem (CARDIZEM) 120 MG tablet Doesn't know dosage    . Estradiol 10 MCG TABS Place 1 tablet (10 mcg total)  vaginally 2 (two) times a week. 8 tablet 12  . valsartan (DIOVAN) 160 MG tablet daily. Dosage know dosage    . NONFORMULARY OR COMPOUNDED ITEM Compounded Ketoprofen 0.5% vaginal cream 30 each 3   No facility-administered medications prior to visit.      Allergies:   Betadine [povidone iodine]; Other; Codeine; and Demerol   Social History   Social History  . Marital status: Married    Spouse name: N/A  . Number of children: N/A  . Years of education: N/A   Social History Main Topics  . Smoking status: Never Smoker  . Smokeless tobacco: Never Used  . Alcohol use No  . Drug use: No  . Sexual activity: Yes    Birth control/ protection: None   Other Topics Concern  . None   Social History Narrative  . None     Family History:  The patient's family history includes Cancer in her maternal grandmother; Diabetes in her maternal grandmother; Hypertension in her mother.   ROS:   Please see the history of present illness.    ROS All other systems reviewed and are negative.   PHYSICAL EXAM:   VS:  BP 118/68   Pulse 60   Ht 5\' 1"  (1.549 m)   Wt 180 lb 1.9 oz (81.7 kg)   BMI 34.03 kg/m    GEN: Well nourished, well  developed, in no acute distress  HEENT: normal  Neck: no JVD, carotid bruits, or masses Cardiac: RRR; soft systolic left lower sternal border murmur, rubs, or gallops,no edema  Respiratory:  clear to auscultation bilaterally, normal work of breathing GI: soft, nontender, nondistended, + BS MS: no deformity or atrophy  Skin: warm and dry, no rash Neuro:  Alert and Oriented x 3, Strength and sensation are intact Psych: euthymic mood, full affect  Wt Readings from Last 3 Encounters:  07/18/16 180 lb 1.9 oz (81.7 kg)  04/15/12 187 lb (84.8 kg)  02/26/12 179 lb (81.2 kg)      Studies/Labs Reviewed:   EKG:  EKG is ordered today.  The ekg ordered today demonstrates 07/18/16-normal sinus rhythm, 60, no other abnormalities  Recent Labs: No results found for  requested labs within last 8760 hours.   Lipid Panel No results found for: CHOL, TRIG, HDL, CHOLHDL, VLDL, LDLCALC, LDLDIRECT  Additional studies/ records that were reviewed today include:  Office note from orthopedics reviewed, EKG reviewed    ASSESSMENT:    1. Pre-operative cardiovascular examination   2. Diabetes mellitus due to underlying condition without complication, without long-term current use of insulin (HCC)      PLAN:  In order of problems listed above:  Preoperative cardiovascular exam prior to knee replacement  - She is able to complete greater than 4 METs of activity without any difficulty, no anginal symptoms, no syncope, no orthopnea, no shortness of breath. She walks over 2 blocks to get to her job in [place]. She had had a previous stress test approximately 4 years ago which was low risk. She may proceed with surgery with low overall cardiac risk. Please let us know if we can be of further assistance. No further cardiac testing warranted based upon current guidelines.  Diabetes  - Metformin, glimepiride. A1c 6.1. Excellent.  Essential hypertension  - Taking diltiazem. Doing well. Valsartan.    Medication Adjustments/Labs and Tests Ordered: Current medicines are reviewed at length with the patient today.  Concerns regarding medicines are outlined above.  Medication changes, Labs and Tests ordered today are listed in the Patient Instructions below. Patient Instructions  Medication Instructions:  Your physician recommends that you continue on your current medications as directed. Please refer to the Current Medication list given to you today.   Follow-Up: Follow-up with Dr. Marlou Porch as needed.      Signed, Candee Furbish, MD  07/18/2016 10:59 AM    [facility] [place] [address], [place], [place]  [address] Phone: [phone]; Fax: [phone]

## 2016-07-30 ENCOUNTER — Other Ambulatory Visit: Payer: Self-pay | Admitting: Obstetrics and Gynecology

## 2016-07-30 DIAGNOSIS — D259 Leiomyoma of uterus, unspecified: Secondary | ICD-10-CM

## 2016-08-08 ENCOUNTER — Other Ambulatory Visit: Payer: Self-pay | Admitting: Orthopedic Surgery

## 2016-08-11 ENCOUNTER — Ambulatory Visit
Admission: RE | Admit: 2016-08-11 | Discharge: 2016-08-11 | Disposition: A | Discharge status: Home / Self Care | Payer: Medicare Other | Source: Ambulatory Visit | Attending: Obstetrics and Gynecology | Admitting: Obstetrics and Gynecology

## 2016-08-11 DIAGNOSIS — D259 Leiomyoma of uterus, unspecified: Secondary | ICD-10-CM

## 2016-10-03 NOTE — Pre-Procedure Instructions (Signed)
Cheryl Good  10/03/2016      [place] [place] [address] Phone: [phone] Fax: [phone]  [id number] - [place], [address] [place] [address] Phone: [phone] Fax: [phone]    Your procedure is scheduled on Monday November 27.  Report to [facility] Admitting at 5:30 A.M.  Call this number if you have problems the morning of surgery:  [phone]   Remember:  Do not eat food or drink liquids after midnight.  Take these medicines the morning of surgery with A SIP OF WATER: amlodipine (norvasc), diltiazem (cardizem), Allegra, flonase if needed, montelukast (singulair), Xopenex inhaler if needed (please bring to hospital with you)  7 days prior to surgery STOP taking any Celebrex (celecoxib), Aspirin, Aleve, Naproxen, Ibuprofen, Motrin, Advil, Goody's, BC's, all herbal medications, fish oil, and all vitamins   WHAT DO I DO ABOUT MY DIABETES MEDICATION?   Marland Kitchen Do not take oral diabetes medicines (pills) the morning of surgery. DO NOT TAKE GLIMEPIRIDE (Amaryl) or metformin the day of surgery    How to Manage Your Diabetes Before and After Surgery  Why is it important to control my blood sugar before and after surgery? . Improving blood sugar levels before and after surgery helps healing and can limit problems. . A way of improving blood sugar control is eating a healthy diet by: o  Eating less sugar and carbohydrates o  Increasing activity/exercise o  Talking with your doctor about reaching your blood sugar goals . High blood sugars (greater than 180 mg/dL) can raise your risk of infections and slow your recovery, so you will need to focus on controlling your diabetes during the weeks before surgery. . Make sure that the doctor who takes care of your diabetes knows about your planned surgery including the  date and location.  How do I manage my blood sugar before surgery? . Check your blood sugar at least 4 times a day, starting 2 days before surgery, to make sure that the level is not too high or low. o Check your blood sugar the morning of your surgery when you wake up and every 2 hours until you get to the Short Stay unit. . If your blood sugar is less than 70 mg/dL, you will need to treat for low blood sugar: o Do not take insulin. o Treat a low blood sugar (less than 70 mg/dL) with  cup of clear juice (cranberry or apple), 4 glucose tablets, OR glucose gel. o Recheck blood sugar in 15 minutes after treatment (to make sure it is greater than 70 mg/dL). If your blood sugar is not greater than 70 mg/dL on recheck, call [phone] for further instructions. . Report your blood sugar to the short stay nurse when you get to Short Stay.  . If you are admitted to the hospital after surgery: o Your blood sugar will be checked by the staff and you will probably be given insulin after surgery (instead of oral diabetes medicines) to make sure you have good blood sugar levels. o The goal for blood sugar control after surgery is 80-180 mg/dL.                Do not wear jewelry, make-up or nail polish.  Do not wear lotions, powders, or perfumes, or deoderant.  Do not shave 48 hours prior to surgery.  Men may  shave face and neck.  Do not bring valuables to the hospital.  [facility] is not responsible for any belongings or valuables.  Contacts, dentures or bridgework may not be worn into surgery.  Leave your suitcase in the car.  After surgery it may be brought to your room.  For patients admitted to the hospital, discharge time will be determined by your treatment team.  Patients discharged the day of surgery will not be allowed to drive home.    [facility]- Preparing For Surgery  Before surgery, you can play an important role. Because skin is not sterile, your skin needs to be as  free of germs as possible. You can reduce the number of germs on your skin by washing with CHG (chlorahexidine gluconate) Soap before surgery.  CHG is an antiseptic cleaner which kills germs and bonds with the skin to continue killing germs even after washing.  Please do not use if you have an allergy to CHG or antibacterial soaps. If your skin becomes reddened/irritated stop using the CHG.  Do not shave (including legs and underarms) for at least 48 hours prior to first CHG shower. It is OK to shave your face.  Please follow these instructions carefully.   1. Shower the NIGHT BEFORE SURGERY and the MORNING OF SURGERY with CHG.   2. If you chose to wash your hair, wash your hair first as usual with your normal shampoo.  3. After you shampoo, rinse your hair and body thoroughly to remove the shampoo.  4. Use CHG as you would any other liquid soap. You can apply CHG directly to the skin and wash gently with a scrungie or a clean washcloth.   5. Apply the CHG Soap to your body ONLY FROM THE NECK DOWN.  Do not use on open wounds or open sores. Avoid contact with your eyes, ears, mouth and genitals (private parts). Wash genitals (private parts) with your normal soap.  6. Wash thoroughly, paying special attention to the area where your surgery will be performed.  7. Thoroughly rinse your body with warm water from the neck down.  8. DO NOT shower/wash with your normal soap after using and rinsing off the CHG Soap.  9. Pat yourself dry with a CLEAN TOWEL.   10. Wear CLEAN PAJAMAS   11. Place CLEAN SHEETS on your bed the night of your first shower and DO NOT SLEEP WITH PETS.    Day of Surgery: Do not apply any deodorants/lotions. Please wear clean clothes to the hospital/surgery center.      Please read over the following fact sheets that you were given. MRSA Information

## 2016-10-04 ENCOUNTER — Encounter (HOSPITAL_COMMUNITY): Payer: Self-pay

## 2016-10-04 ENCOUNTER — Encounter (HOSPITAL_COMMUNITY)
Admission: RE | Admit: 2016-10-04 | Discharge: 2016-10-04 | Disposition: A | Discharge status: Home / Self Care | Payer: Medicare Other | Source: Ambulatory Visit | Attending: Orthopedic Surgery | Admitting: Orthopedic Surgery

## 2016-10-04 DIAGNOSIS — Z01818 Encounter for other preprocedural examination: Secondary | ICD-10-CM | POA: Diagnosis not present

## 2016-10-04 DIAGNOSIS — E119 Type 2 diabetes mellitus without complications: Secondary | ICD-10-CM | POA: Diagnosis not present

## 2016-10-04 HISTORY — DX: Unspecified osteoarthritis, unspecified site: M19.90

## 2016-10-04 HISTORY — DX: Other complications of anesthesia, initial encounter: T88.59XA

## 2016-10-04 HISTORY — DX: Adverse effect of unspecified anesthetic, initial encounter: T41.45XA

## 2016-10-04 HISTORY — DX: Cardiac murmur, unspecified: R01.1

## 2016-10-04 LAB — COMPREHENSIVE METABOLIC PANEL
ALT: 19 U/L (ref 14–54)
AST: 25 U/L (ref 15–41)
Albumin: 3.6 g/dL (ref 3.5–5.0)
Alkaline Phosphatase: 56 U/L (ref 38–126)
Anion gap: 7 (ref 5–15)
BUN: 21 mg/dL — ABNORMAL HIGH (ref 6–20)
CO2: 27 mmol/L (ref 22–32)
Calcium: 9.5 mg/dL (ref 8.9–10.3)
Chloride: 109 mmol/L (ref 101–111)
Creatinine, Ser: 1.14 mg/dL — ABNORMAL HIGH (ref 0.44–1.00)
GFR calc Af Amer: 55 mL/min — ABNORMAL LOW (ref >60)
GFR calc non Af Amer: 48 mL/min — ABNORMAL LOW (ref >60)
Glucose, Bld: 180 mg/dL — ABNORMAL HIGH (ref 65–99)
Potassium: 3.6 mmol/L (ref 3.5–5.1)
Sodium: 143 mmol/L (ref 135–145)
Total Bilirubin: 0.4 mg/dL (ref 0.3–1.2)
Total Protein: 6.6 g/dL (ref 6.5–8.1)

## 2016-10-04 LAB — SURGICAL PCR SCREEN
MRSA, PCR: NEGATIVE
Staphylococcus aureus: NEGATIVE

## 2016-10-04 LAB — CBC WITH DIFFERENTIAL/PLATELET
Basophils Absolute: 0 10*3/uL (ref 0.0–0.1)
Basophils Relative: 0 %
Eosinophils Absolute: 0.2 10*3/uL (ref 0.0–0.7)
Eosinophils Relative: 3 %
HCT: 37.9 % (ref 36.0–46.0)
Hemoglobin: 12 g/dL (ref 12.0–15.0)
Lymphocytes Relative: 27 %
Lymphs Abs: 1.7 10*3/uL (ref 0.7–4.0)
MCH: 27.8 pg (ref 26.0–34.0)
MCHC: 31.7 g/dL (ref 30.0–36.0)
MCV: 87.9 fL (ref 78.0–100.0)
Monocytes Absolute: 0.4 10*3/uL (ref 0.1–1.0)
Monocytes Relative: 7 %
Neutro Abs: 4.1 10*3/uL (ref 1.7–7.7)
Neutrophils Relative %: 63 %
Platelets: 257 10*3/uL (ref 150–400)
RBC: 4.31 MIL/uL (ref 3.87–5.11)
RDW: 14.3 % (ref 11.5–15.5)
WBC: 6.5 10*3/uL (ref 4.0–10.5)

## 2016-10-04 LAB — GLUCOSE, CAPILLARY: Glucose-Capillary: 188 mg/dL — ABNORMAL HIGH (ref 65–99)

## 2016-10-04 NOTE — Progress Notes (Signed)
PCP: Lucianne Lei, A1c requested from office.  Cardiologist: Dr. Marlou Porch, clearance note in epic  ECHO and Stress test performed 4 yr ago per pt, in [place], pt states she does not know where this was done or what doctor ordered these tests. Pt reports no cardiac hx  EKG: 07/18/16 No chest pain, SOB or signs of infection per pt at PAT appointment.   Pt requesting to speak with anesthesiologist regarding allergies to anesthesia medicines. Annamary Carolin notified.

## 2016-10-04 NOTE — Progress Notes (Addendum)
Anesthesia PAT Evaluation: Patient is a 70 year old female scheduled for right TKA on 10/14/16 (first case) by Dr. Ronnie Derby  Case is posted for spinal anesthesia.   History includes never smoker, hypertension, diabetes mellitus type 2, murmur, arthritis, environmental allergies, left bunionectomy, left breast lumpectomy.   SHE IS ALLERGIC TO AMINO ESTERS (procaine, chloroprocaine, tetracaine) which causes laryngospasms, bronchospasms, wheezing, difficulty breathing. (She specifically had reaction to tetracaine during an eye exam to evaluate for glaucoma. She had another reaction to an ester during a dental procedure.) She reports that she CAN TAKE AMINO AMIDES (lidocaine, prilocaine, mepivacaine, bupivacaine, etidocaine). She is also has a low tolerance ("light weight") when it comes to receiving anesthetics.  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 Ara Kussmaul (ophthalmologist). She flies back to [place] about every two weeks.  PCP is Dr. Lu Duffel.  She was seen by cardiologist Dr. Candee Furbish on 07/18/16 for pre-operative evaluation. He wrote, "She is able to complete greater than 4 METs of activity without any difficulty, no anginal symptoms, no syncope, no orthopnea, no shortness of breath. She walks over 2 blocks to get to her job in [place]. She had had a previous stress test approximately 4 years ago which was low risk. She may proceed with surgery with low overall cardiac risk. Please let us know if we can be of further assistance. No further cardiac testing warranted based upon current guidelines." PRN cardiology follow-up recommended.  Meds include amlodipine, Bydureon, calcium carbonate, Celebrex, chlorthalidone, Allegra, Flonase, Amaryl, Zaditor ophthalmic, Xopenex (uses ~ 2-3X/week), Singulair,  Diovan-HCT. She also gets allergy shots.  BP (!) 142/57   Pulse 80   Temp 36.6 C   Resp 20   Ht 5\' 1"  (1.549 m)   Wt 187 lb 14.4 oz (85.2 kg)   SpO2 96%   BMI 35.50 kg/m   07/18/16 EKG: NSR.  She reports prior stress and echo by Dr. Daneen Schick when he was with [facility]. Records requested. She denied history of aortic stenosis.   Preoperative labs noted. A1c requested from Dr. Fransico Setters office.   Dr. Ricki Miller requested to speak with one of our anesthesiologist while she was at her PAT appointment. Anesthesiologist Dr. Marcell Barlow spoke with her. He said that bupivacaine would likely be uses along with propofol. She will also speak with her assigned anesthesiologist on the day of surgery. Will follow-up any additional records received from Dr. Criss Rosales or the former [facility] office.  George Hugh [facility] Short Stay Center/Anesthesiology Phone [phone] 10/04/2016 5:11 PM  Addendum: I called again for recent A1c results from Dr. Fransico Setters office. Records still pending.  I did receive records from the former [facility]. Patient was last seen there by Dr. Tamala Julian in 2011. She had a normal exercise treadmill stress test on 01/13/03 and essentially normal echo (LVEF normal, mild LA dilatation, no valvular disease) on 07/20/97.  George Hugh [facility] Short Stay Center/Anesthesiology Phone [phone] 10/08/2016 12:42 PM

## 2016-10-11 MED ORDER — TRANEXAMIC ACID 1000 MG/10ML IV SOLN
1000.0000 mg | INTRAVENOUS | Status: AC
  Administered 2016-10-14: 1000 mg via INTRAVENOUS
  Filled 2016-10-11: qty 10

## 2016-10-11 MED ORDER — BUPIVACAINE LIPOSOME 1.3 % IJ SUSP
20.0000 mL | INTRAMUSCULAR | Status: AC
  Administered 2016-10-14: 20 mL
  Filled 2016-10-11: qty 20

## 2016-10-14 ENCOUNTER — Inpatient Hospital Stay (HOSPITAL_COMMUNITY)
Admission: RE | Admit: 2016-10-14 | Discharge: 2016-10-15 | DRG: 470 | Disposition: A | Discharge status: Home Health | Payer: Medicare Other | Source: Ambulatory Visit | Attending: Orthopedic Surgery | Admitting: Orthopedic Surgery

## 2016-10-14 ENCOUNTER — Inpatient Hospital Stay (HOSPITAL_COMMUNITY): Payer: Medicare Other | Admitting: Certified Registered"

## 2016-10-14 ENCOUNTER — Encounter (HOSPITAL_COMMUNITY): Payer: Self-pay | Admitting: *Deleted

## 2016-10-14 ENCOUNTER — Inpatient Hospital Stay (HOSPITAL_COMMUNITY): Payer: Medicare Other | Admitting: Vascular Surgery

## 2016-10-14 ENCOUNTER — Encounter (HOSPITAL_COMMUNITY)
Admission: RE | Disposition: A | Discharge status: Home Health | Payer: Self-pay | Source: Ambulatory Visit | Attending: Orthopedic Surgery

## 2016-10-14 DIAGNOSIS — Z7951 Long term (current) use of inhaled steroids: Secondary | ICD-10-CM | POA: Diagnosis not present

## 2016-10-14 DIAGNOSIS — Z885 Allergy status to narcotic agent status: Secondary | ICD-10-CM

## 2016-10-14 DIAGNOSIS — Z96659 Presence of unspecified artificial knee joint: Secondary | ICD-10-CM

## 2016-10-14 DIAGNOSIS — I10 Essential (primary) hypertension: Secondary | ICD-10-CM | POA: Diagnosis present

## 2016-10-14 DIAGNOSIS — Z8249 Family history of ischemic heart disease and other diseases of the circulatory system: Secondary | ICD-10-CM | POA: Diagnosis not present

## 2016-10-14 DIAGNOSIS — E119 Type 2 diabetes mellitus without complications: Secondary | ICD-10-CM | POA: Diagnosis present

## 2016-10-14 DIAGNOSIS — M25561 Pain in right knee: Secondary | ICD-10-CM | POA: Diagnosis present

## 2016-10-14 DIAGNOSIS — Z7984 Long term (current) use of oral hypoglycemic drugs: Secondary | ICD-10-CM

## 2016-10-14 DIAGNOSIS — Z809 Family history of malignant neoplasm, unspecified: Secondary | ICD-10-CM | POA: Diagnosis not present

## 2016-10-14 DIAGNOSIS — Z833 Family history of diabetes mellitus: Secondary | ICD-10-CM

## 2016-10-14 DIAGNOSIS — Z888 Allergy status to other drugs, medicaments and biological substances status: Secondary | ICD-10-CM

## 2016-10-14 DIAGNOSIS — R011 Cardiac murmur, unspecified: Secondary | ICD-10-CM | POA: Diagnosis present

## 2016-10-14 DIAGNOSIS — M1711 Unilateral primary osteoarthritis, right knee: Secondary | ICD-10-CM | POA: Diagnosis present

## 2016-10-14 HISTORY — PX: TOTAL KNEE ARTHROPLASTY: SHX125

## 2016-10-14 LAB — GLUCOSE, CAPILLARY
Glucose-Capillary: 150 mg/dL — ABNORMAL HIGH (ref 65–99)
Glucose-Capillary: 249 mg/dL — ABNORMAL HIGH (ref 65–99)
Glucose-Capillary: 198 mg/dL — ABNORMAL HIGH (ref 65–99)

## 2016-10-14 SURGERY — ARTHROPLASTY, KNEE, TOTAL
Anesthesia: Spinal | Site: Knee | Laterality: Right

## 2016-10-14 MED ORDER — ZOLPIDEM TARTRATE 5 MG PO TABS: 5.0000 mg | ORAL_TABLET | Freq: Every evening | ORAL | Status: DC | PRN

## 2016-10-14 MED ORDER — PROPOFOL 500 MG/50ML IV EMUL
INTRAVENOUS | Status: DC | PRN
  Administered 2016-10-14: 75 ug/kg/min via INTRAVENOUS

## 2016-10-14 MED ORDER — DOCUSATE SODIUM 100 MG PO CAPS
100.0000 mg | ORAL_CAPSULE | Freq: Two times a day (BID) | ORAL | Status: DC
  Administered 2016-10-14 – 2016-10-15 (×2): 100 mg via ORAL
  Filled 2016-10-14 (×3): qty 1

## 2016-10-14 MED ORDER — INSULIN ASPART 100 UNIT/ML ~~LOC~~ SOLN
0.0000 [IU] | Freq: Three times a day (TID) | SUBCUTANEOUS | Status: DC
  Administered 2016-10-14: 5 [IU] via SUBCUTANEOUS
  Administered 2016-10-15: 2 [IU] via SUBCUTANEOUS
  Administered 2016-10-15: 3 [IU] via SUBCUTANEOUS
  Administered 2016-10-15: 8 [IU] via SUBCUTANEOUS

## 2016-10-14 MED ORDER — METHOCARBAMOL 1000 MG/10ML IJ SOLN
500.0000 mg | Freq: Four times a day (QID) | INTRAVENOUS | Status: DC | PRN
  Filled 2016-10-14: qty 5

## 2016-10-14 MED ORDER — MIDAZOLAM HCL 2 MG/2ML IJ SOLN
INTRAMUSCULAR | Status: DC | PRN
  Administered 2016-10-14: 2 mg via INTRAVENOUS

## 2016-10-14 MED ORDER — BUPIVACAINE-EPINEPHRINE (PF) 0.5% -1:200000 IJ SOLN
INTRAMUSCULAR | Status: DC | PRN
  Administered 2016-10-14: 20 mL via PERINEURAL

## 2016-10-14 MED ORDER — PHENOL 1.4 % MT LIQD: 1.0000 | OROMUCOSAL | Status: DC | PRN

## 2016-10-14 MED ORDER — PHENYLEPHRINE 40 MCG/ML (10ML) SYRINGE FOR IV PUSH (FOR BLOOD PRESSURE SUPPORT)
PREFILLED_SYRINGE | INTRAVENOUS | Status: AC
  Filled 2016-10-14: qty 10

## 2016-10-14 MED ORDER — MENTHOL 3 MG MT LOZG: 1.0000 | LOZENGE | OROMUCOSAL | Status: DC | PRN

## 2016-10-14 MED ORDER — BUPIVACAINE HCL (PF) 0.25 % IJ SOLN
INTRAMUSCULAR | Status: DC | PRN
  Administered 2016-10-14: 30 mL

## 2016-10-14 MED ORDER — DIPHENHYDRAMINE HCL 12.5 MG/5ML PO ELIX: 12.5000 mg | ORAL_SOLUTION | ORAL | Status: DC | PRN

## 2016-10-14 MED ORDER — PHENYLEPHRINE HCL 10 MG/ML IJ SOLN
INTRAMUSCULAR | Status: DC | PRN
  Administered 2016-10-14 (×2): 80 ug via INTRAVENOUS

## 2016-10-14 MED ORDER — SODIUM CHLORIDE 0.9 % IJ SOLN
INTRAMUSCULAR | Status: DC | PRN
  Administered 2016-10-14: 20 mL via INTRAVENOUS

## 2016-10-14 MED ORDER — HYDROCHLOROTHIAZIDE 12.5 MG PO CAPS
12.5000 mg | ORAL_CAPSULE | Freq: Every day | ORAL | Status: DC
  Administered 2016-10-14 – 2016-10-15 (×2): 12.5 mg via ORAL
  Filled 2016-10-14 (×2): qty 1

## 2016-10-14 MED ORDER — METOCLOPRAMIDE HCL 5 MG/ML IJ SOLN: 5.0000 mg | Freq: Three times a day (TID) | INTRAMUSCULAR | Status: DC | PRN

## 2016-10-14 MED ORDER — ASPIRIN EC 325 MG PO TBEC
325.0000 mg | DELAYED_RELEASE_TABLET | Freq: Two times a day (BID) | ORAL | Status: DC
  Administered 2016-10-14 – 2016-10-15 (×3): 325 mg via ORAL
  Filled 2016-10-14 (×3): qty 1

## 2016-10-14 MED ORDER — ONDANSETRON HCL 4 MG/2ML IJ SOLN
4.0000 mg | Freq: Four times a day (QID) | INTRAMUSCULAR | Status: DC | PRN
Start: 2016-10-14 — End: 2016-10-15

## 2016-10-14 MED ORDER — CEFAZOLIN IN D5W 1 GM/50ML IV SOLN
1.0000 g | Freq: Four times a day (QID) | INTRAVENOUS | Status: AC
  Administered 2016-10-14 (×2): 1 g via INTRAVENOUS
  Filled 2016-10-14 (×2): qty 50

## 2016-10-14 MED ORDER — CHLORHEXIDINE GLUCONATE 4 % EX LIQD: 60.0000 mL | Freq: Once | CUTANEOUS | Status: DC

## 2016-10-14 MED ORDER — AMLODIPINE BESYLATE 2.5 MG PO TABS
2.5000 mg | ORAL_TABLET | Freq: Every day | ORAL | Status: DC
  Administered 2016-10-14 – 2016-10-15 (×2): 2.5 mg via ORAL
  Filled 2016-10-14 (×2): qty 1

## 2016-10-14 MED ORDER — ACETAMINOPHEN 500 MG PO TABS
1000.0000 mg | ORAL_TABLET | Freq: Once | ORAL | Status: AC
  Administered 2016-10-14: 1000 mg via ORAL
  Filled 2016-10-14: qty 2

## 2016-10-14 MED ORDER — BISACODYL 5 MG PO TBEC: 5.0000 mg | DELAYED_RELEASE_TABLET | Freq: Every day | ORAL | Status: DC | PRN

## 2016-10-14 MED ORDER — ACETAMINOPHEN 325 MG PO TABS: 650.0000 mg | ORAL_TABLET | Freq: Four times a day (QID) | ORAL | Status: DC | PRN

## 2016-10-14 MED ORDER — HYDROCODONE-ACETAMINOPHEN 5-325 MG PO TABS
1.0000 | ORAL_TABLET | ORAL | Status: DC | PRN
  Administered 2016-10-14 – 2016-10-15 (×3): 1 via ORAL
  Filled 2016-10-14 (×3): qty 1

## 2016-10-14 MED ORDER — ONDANSETRON HCL 4 MG/2ML IJ SOLN: 4.0000 mg | Freq: Once | INTRAMUSCULAR | Status: DC | PRN

## 2016-10-14 MED ORDER — METHOCARBAMOL 500 MG PO TABS: 500.0000 mg | ORAL_TABLET | Freq: Four times a day (QID) | ORAL | Status: DC | PRN

## 2016-10-14 MED ORDER — EPINEPHRINE PF 1 MG/ML IJ SOLN
INTRAMUSCULAR | Status: DC | PRN
  Administered 2016-10-14: .15 mL

## 2016-10-14 MED ORDER — SODIUM CHLORIDE 0.9 % IV SOLN: INTRAVENOUS | Status: DC

## 2016-10-14 MED ORDER — EPHEDRINE SULFATE 50 MG/ML IJ SOLN
INTRAMUSCULAR | Status: DC | PRN
  Administered 2016-10-14: 10 mg via INTRAVENOUS

## 2016-10-14 MED ORDER — FENTANYL CITRATE (PF) 100 MCG/2ML IJ SOLN
INTRAMUSCULAR | Status: DC | PRN
  Administered 2016-10-14: 50 ug via INTRAVENOUS

## 2016-10-14 MED ORDER — DEXAMETHASONE SODIUM PHOSPHATE 10 MG/ML IJ SOLN
10.0000 mg | Freq: Once | INTRAMUSCULAR | Status: AC
  Administered 2016-10-15: 10 mg via INTRAVENOUS
  Filled 2016-10-14: qty 1

## 2016-10-14 MED ORDER — SODIUM CHLORIDE 0.9 % IR SOLN
Status: DC | PRN
Start: 2016-10-14 — End: 2016-10-14
  Administered 2016-10-14: 3000 mL

## 2016-10-14 MED ORDER — SODIUM CHLORIDE 0.9 % IV SOLN
INTRAVENOUS | Status: DC
  Administered 2016-10-14: 13:00:00 via INTRAVENOUS

## 2016-10-14 MED ORDER — DEXAMETHASONE SODIUM PHOSPHATE 10 MG/ML IJ SOLN
8.0000 mg | Freq: Once | INTRAMUSCULAR | Status: AC
  Administered 2016-10-14: 10 mg via INTRAVENOUS
  Filled 2016-10-14: qty 1

## 2016-10-14 MED ORDER — ALBUTEROL SULFATE (2.5 MG/3ML) 0.083% IN NEBU: 2.5000 mg | INHALATION_SOLUTION | RESPIRATORY_TRACT | Status: DC | PRN

## 2016-10-14 MED ORDER — CHLORTHALIDONE 25 MG PO TABS
25.0000 mg | ORAL_TABLET | Freq: Every day | ORAL | Status: DC
  Administered 2016-10-14 – 2016-10-15 (×2): 25 mg via ORAL
  Filled 2016-10-14 (×2): qty 1

## 2016-10-14 MED ORDER — PHENYLEPHRINE HCL 10 MG/ML IJ SOLN
INTRAVENOUS | Status: DC | PRN
  Administered 2016-10-14: 30 ug/min via INTRAVENOUS

## 2016-10-14 MED ORDER — ONDANSETRON HCL 4 MG PO TABS: 4.0000 mg | ORAL_TABLET | Freq: Four times a day (QID) | ORAL | Status: DC | PRN

## 2016-10-14 MED ORDER — VALSARTAN-HYDROCHLOROTHIAZIDE 160-12.5 MG PO TABS: 1.0000 | ORAL_TABLET | Freq: Every day | ORAL | Status: DC

## 2016-10-14 MED ORDER — METOCLOPRAMIDE HCL 5 MG PO TABS: 5.0000 mg | ORAL_TABLET | Freq: Three times a day (TID) | ORAL | Status: DC | PRN

## 2016-10-14 MED ORDER — ACETAMINOPHEN 650 MG RE SUPP: 650.0000 mg | Freq: Four times a day (QID) | RECTAL | Status: DC | PRN

## 2016-10-14 MED ORDER — FLEET ENEMA 7-19 GM/118ML RE ENEM: 1.0000 | ENEMA | Freq: Once | RECTAL | Status: DC | PRN

## 2016-10-14 MED ORDER — PROPOFOL 10 MG/ML IV BOLUS
INTRAVENOUS | Status: AC
  Filled 2016-10-14: qty 20

## 2016-10-14 MED ORDER — LACTATED RINGERS IV SOLN
INTRAVENOUS | Status: DC | PRN
  Administered 2016-10-14 (×2): via INTRAVENOUS

## 2016-10-14 MED ORDER — CELECOXIB 200 MG PO CAPS
200.0000 mg | ORAL_CAPSULE | Freq: Two times a day (BID) | ORAL | Status: DC
  Administered 2016-10-14 – 2016-10-15 (×3): 200 mg via ORAL
  Filled 2016-10-14 (×3): qty 1

## 2016-10-14 MED ORDER — CEFAZOLIN SODIUM-DEXTROSE 2-4 GM/100ML-% IV SOLN
2.0000 g | INTRAVENOUS | Status: AC
  Administered 2016-10-14: 2 g via INTRAVENOUS
  Filled 2016-10-14: qty 100

## 2016-10-14 MED ORDER — TRANEXAMIC ACID 1000 MG/10ML IV SOLN
1000.0000 mg | Freq: Once | INTRAVENOUS | Status: DC
  Filled 2016-10-14 (×3): qty 10

## 2016-10-14 MED ORDER — EPINEPHRINE PF 1 MG/ML IJ SOLN
INTRAMUSCULAR | Status: AC
  Filled 2016-10-14: qty 1

## 2016-10-14 MED ORDER — GLIMEPIRIDE 4 MG PO TABS
2.0000 mg | ORAL_TABLET | Freq: Every day | ORAL | Status: DC
Start: 2016-10-15 — End: 2016-10-15
  Administered 2016-10-15: 2 mg via ORAL
  Filled 2016-10-14: qty 1

## 2016-10-14 MED ORDER — FLUTICASONE PROPIONATE 50 MCG/ACT NA SUSP
2.0000 | Freq: Every day | NASAL | Status: DC
  Administered 2016-10-14 – 2016-10-15 (×2): 2 via NASAL
  Filled 2016-10-14: qty 16

## 2016-10-14 MED ORDER — METFORMIN HCL 500 MG PO TABS
500.0000 mg | ORAL_TABLET | Freq: Every day | ORAL | Status: DC
Start: 2016-10-14 — End: 2016-10-15
  Administered 2016-10-15: 500 mg via ORAL
  Filled 2016-10-14: qty 1

## 2016-10-14 MED ORDER — SENNOSIDES-DOCUSATE SODIUM 8.6-50 MG PO TABS: 1.0000 | ORAL_TABLET | Freq: Every evening | ORAL | Status: DC | PRN

## 2016-10-14 MED ORDER — ALUM & MAG HYDROXIDE-SIMETH 200-200-20 MG/5ML PO SUSP: 30.0000 mL | ORAL | Status: DC | PRN

## 2016-10-14 MED ORDER — LEVALBUTEROL TARTRATE 45 MCG/ACT IN AERO: 2.0000 | INHALATION_SPRAY | RESPIRATORY_TRACT | Status: DC | PRN

## 2016-10-14 MED ORDER — HYDROMORPHONE HCL 1 MG/ML IJ SOLN: 0.2500 mg | INTRAMUSCULAR | Status: DC | PRN

## 2016-10-14 MED ORDER — IRBESARTAN 150 MG PO TABS
150.0000 mg | ORAL_TABLET | Freq: Every day | ORAL | Status: DC
  Administered 2016-10-14 – 2016-10-15 (×2): 150 mg via ORAL
  Filled 2016-10-14 (×2): qty 1

## 2016-10-14 MED ORDER — BUPIVACAINE HCL (PF) 0.25 % IJ SOLN
INTRAMUSCULAR | Status: AC
  Filled 2016-10-14: qty 30

## 2016-10-14 MED ORDER — MIDAZOLAM HCL 2 MG/2ML IJ SOLN
INTRAMUSCULAR | Status: AC
  Filled 2016-10-14: qty 2

## 2016-10-14 MED ORDER — GABAPENTIN 300 MG PO CAPS
300.0000 mg | ORAL_CAPSULE | Freq: Three times a day (TID) | ORAL | Status: DC
  Administered 2016-10-14 – 2016-10-15 (×4): 300 mg via ORAL
  Filled 2016-10-14 (×4): qty 1

## 2016-10-14 MED ORDER — FENTANYL CITRATE (PF) 100 MCG/2ML IJ SOLN
INTRAMUSCULAR | Status: AC
  Filled 2016-10-14: qty 2

## 2016-10-14 SURGICAL SUPPLY — 57 items
BANDAGE ACE 6X5 VEL STRL LF (GAUZE/BANDAGES/DRESSINGS) ×2 IMPLANT
BANDAGE ESMARK 6X9 LF (GAUZE/BANDAGES/DRESSINGS) ×1 IMPLANT
BLADE SAGITTAL 13X1.27X60 (BLADE) ×2 IMPLANT
BLADE SAW SGTL 83.5X18.5 (BLADE) ×2 IMPLANT
BLADE SURG 10 STRL SS (BLADE) ×2 IMPLANT
BNDG CMPR 9X6 STRL LF SNTH (GAUZE/BANDAGES/DRESSINGS) ×1
BNDG ESMARK 6X9 LF (GAUZE/BANDAGES/DRESSINGS) ×2
BOWL SMART MIX CTS (DISPOSABLE) ×2 IMPLANT
CAPT KNEE TOTAL 3 ×2 IMPLANT
CEMENT BONE SIMPLEX SPEEDSET (Cement) ×4 IMPLANT
COVER SURGICAL LIGHT HANDLE (MISCELLANEOUS) ×2 IMPLANT
CUFF TOURNIQUET SINGLE 34IN LL (TOURNIQUET CUFF) ×2 IMPLANT
DRAPE EXTREMITY T 121X128X90 (DRAPE) ×2 IMPLANT
DRAPE HALF SHEET 40X57 (DRAPES) ×2 IMPLANT
DRAPE INCISE IOBAN 66X45 STRL (DRAPES) ×4 IMPLANT
DRAPE U-SHAPE 47X51 STRL (DRAPES) ×2 IMPLANT
DRSG AQUACEL AG ADV 3.5X10 (GAUZE/BANDAGES/DRESSINGS) ×2 IMPLANT
DURAPREP 26ML APPLICATOR (WOUND CARE) ×4 IMPLANT
ELECT REM PT RETURN 9FT ADLT (ELECTROSURGICAL) ×2
ELECTRODE REM PT RTRN 9FT ADLT (ELECTROSURGICAL) ×1 IMPLANT
GLOVE BIOGEL M 7.0 STRL (GLOVE) IMPLANT
GLOVE BIOGEL PI IND STRL 7.5 (GLOVE) IMPLANT
GLOVE BIOGEL PI IND STRL 8.5 (GLOVE) ×1 IMPLANT
GLOVE BIOGEL PI INDICATOR 7.5 (GLOVE)
GLOVE BIOGEL PI INDICATOR 8.5 (GLOVE) ×1
GLOVE SURG ORTHO 8.0 STRL STRW (GLOVE) ×4 IMPLANT
GOWN STRL REUS W/ TWL LRG LVL3 (GOWN DISPOSABLE) ×1 IMPLANT
GOWN STRL REUS W/ TWL XL LVL3 (GOWN DISPOSABLE) ×2 IMPLANT
GOWN STRL REUS W/TWL 2XL LVL3 (GOWN DISPOSABLE) ×2 IMPLANT
GOWN STRL REUS W/TWL LRG LVL3 (GOWN DISPOSABLE) ×2
GOWN STRL REUS W/TWL XL LVL3 (GOWN DISPOSABLE) ×4
HANDPIECE INTERPULSE COAX TIP (DISPOSABLE) ×2
HOOD PEEL AWAY FACE SHEILD DIS (HOOD) ×6 IMPLANT
KIT BASIN OR (CUSTOM PROCEDURE TRAY) ×2 IMPLANT
KIT ROOM TURNOVER OR (KITS) ×2 IMPLANT
KNEE CAPITATED TOTAL 3 IMPLANT
MANIFOLD NEPTUNE II (INSTRUMENTS) ×2 IMPLANT
NEEDLE 22X1 1/2 (OR ONLY) (NEEDLE) ×4 IMPLANT
NS IRRIG 1000ML POUR BTL (IV SOLUTION) ×2 IMPLANT
PACK TOTAL JOINT (CUSTOM PROCEDURE TRAY) ×2 IMPLANT
PAD ARMBOARD 7.5X6 YLW CONV (MISCELLANEOUS) ×4 IMPLANT
SET HNDPC FAN SPRY TIP SCT (DISPOSABLE) ×1 IMPLANT
STRIP CLOSURE SKIN 1/2X4 (GAUZE/BANDAGES/DRESSINGS) ×2 IMPLANT
SUCTION FRAZIER HANDLE 10FR (MISCELLANEOUS)
SUCTION TUBE FRAZIER 10FR DISP (MISCELLANEOUS) IMPLANT
SUT BONE WAX W31G (SUTURE) ×1 IMPLANT
SUT MNCRL AB 3-0 PS2 18 (SUTURE) ×2 IMPLANT
SUT VIC AB 0 CTB1 27 (SUTURE) ×4 IMPLANT
SUT VIC AB 1 CT1 27 (SUTURE) ×4
SUT VIC AB 1 CT1 27XBRD ANBCTR (SUTURE) ×2 IMPLANT
SUT VIC AB 2-0 CT1 27 (SUTURE) ×4
SUT VIC AB 2-0 CT1 TAPERPNT 27 (SUTURE) ×2 IMPLANT
SYR 20CC LL (SYRINGE) ×4 IMPLANT
TOWEL OR 17X24 6PK STRL BLUE (TOWEL DISPOSABLE) ×2 IMPLANT
TOWEL OR 17X26 10 PK STRL BLUE (TOWEL DISPOSABLE) ×2 IMPLANT
TRAY CATH 16FR W/PLASTIC CATH (SET/KITS/TRAYS/PACK) IMPLANT
WRAP KNEE MAXI GEL POST OP (GAUZE/BANDAGES/DRESSINGS) ×2 IMPLANT

## 2016-10-14 NOTE — Anesthesia Procedure Notes (Signed)
Spinal  Patient location during procedure: OR Start time: 10/14/2016 7:25 AM End time: 10/14/2016 7:30 AM Staffing Anesthesiologist: Lillia Abed Performed: anesthesiologist  Preanesthetic Checklist Completed: patient identified, surgical consent, pre-op evaluation, timeout performed, IV checked, risks and benefits discussed and monitors and equipment checked Spinal Block Patient position: sitting Prep: ChloraPrep Patient monitoring: heart rate, cardiac monitor, continuous pulse ox and blood pressure Approach: right paramedian Location: L3-4 Injection technique: single-shot Needle Needle type: Pencan  Needle gauge: 24 G Needle length: 9 cm Needle insertion depth: 6 cm

## 2016-10-14 NOTE — Evaluation (Signed)
Physical Therapy Evaluation Patient Details Name: Cheryl Good MRN: [id number] DOB: Nov 04, 1946 Today's Date: 10/14/2016   History of Present Illness  Pt admitted for R TKA on 11/27. H/o dm. Fell with RNing earlier in day due to spinal block not wearing off.  Clinical Impression  Pt is s/p TKA resulting in the deficits listed below (see PT Problem List). Pt with numbness in R LE and unable to stand safely unless pt with KI (would need an order.) Pt motivated and suspect will due great once pt gains sensation back in leg. Pt with R swollen and sore ankle from fall earlier in day with RNing. Pt will benefit from skilled PT to increase their independence and safety with mobility to allow discharge to the venue listed below.      Follow Up Recommendations Home health PT;Supervision/Assistance - 24 hour    Equipment Recommendations  Rolling walker with 5" wheels;3in1 (PT)    Recommendations for Other Services       Precautions / Restrictions Precautions Precautions: Fall;Knee Precaution Booklet Issued: Yes (comment) Precaution Comments: pt with verbal understanding, knee locked in ext on bed Restrictions Weight Bearing Restrictions: Yes LLE Weight Bearing: Weight bearing as tolerated      Mobility  Bed Mobility Overal bed mobility: Needs Assistance Bed Mobility: Supine to Sit;Sit to Supine     Supine to sit: Min assist Sit to supine: Min assist   General bed mobility comments: minA for LE management  Transfers Overall transfer level: Needs assistance Equipment used: Rolling walker (2 wheeled) Transfers: Sit to/from Stand Sit to Stand: Mod assist         General transfer comment: pt unable to place weight on R LE without buckling despite max verbal and tactile cues. attempted x 2. pt with no sensation in LE  Ambulation/Gait                Stairs            Wheelchair Mobility    Modified Rankin (Stroke Patients Only)       Balance Overall  balance assessment: Needs assistance         Standing balance support: Bilateral upper extremity supported Standing balance-Leahy Scale: Poor Standing balance comment: needs RW and external assist due to R LE numbness                             Pertinent Vitals/Pain Pain Assessment: 0-10 Pain Score: 7  Pain Location: R ankle from the sprain from fall Pain Descriptors / Indicators: Aching Pain Intervention(s): Monitored during session    Home Living Family/patient expects to be discharged to:: Private residence Living Arrangements: Spouse/significant other Available Help at Discharge: Family;Available 24 hours/day Type of Home: House Home Access: Stairs to enter Entrance Stairs-Rails: None Entrance Stairs-Number of Steps: 3 Home Layout: One level        Prior Function Level of Independence: Independent         Comments: works in higher up Therapist, sports in [place].     Hand Dominance   Dominant Hand: Right    Extremity/Trunk Assessment   Upper Extremity Assessment: Overall WFL for tasks assessed           Lower Extremity Assessment: RLE deficits/detail RLE Deficits / Details: pt unable to feel knee/ankle/foot, initiated quad set but not complete, able to flex knee to 30 deg actively    Cervical / Trunk Assessment: Normal  Communication   Communication:  No difficulties  Cognition Arousal/Alertness: Awake/alert Behavior During Therapy: WFL for tasks assessed/performed Overall Cognitive Status: Within Functional Limits for tasks assessed                      General Comments General comments (skin integrity, edema, etc.): pt with drainage out of lateral side of R knee, RN aware. pt assisted onto bed pain, due to incontience from spinal block.    Exercises Total Joint Exercises Ankle Circles/Pumps: AROM;Both;10 reps;Supine Quad Sets: AROM;Right;10 reps;Supine Gluteal Sets: AAROM;Right;10 reps;Supine   Assessment/Plan    PT Assessment Patient  needs continued PT services  PT Problem List Decreased strength;Decreased range of motion;Decreased activity tolerance;Decreased mobility;Decreased balance;Decreased knowledge of use of DME;Impaired sensation          PT Treatment Interventions DME instruction;Gait training;Stair training;Functional mobility training;Therapeutic activities;Therapeutic exercise;Balance training;Neuromuscular re-education    PT Goals (Current goals can be found in the Care Plan section)  Acute Rehab PT Goals Patient Stated Goal: get my knee working right PT Goal Formulation: With patient Time For Goal Achievement: 10/21/16 Potential to Achieve Goals: Good    Frequency 7X/week   Barriers to discharge        Co-evaluation               End of Session Equipment Utilized During Treatment: Gait belt Activity Tolerance: Patient tolerated treatment well Patient left: in bed;with call bell/phone within reach;with bed alarm set Nurse Communication: Mobility status (pt on bed pan)         Time: [id number] PT Time Calculation (min) (ACUTE ONLY): 27 min   Charges:   PT Evaluation $PT Eval Moderate Complexity: 1 Procedure PT Treatments $Therapeutic Activity: 8-22 mins   PT G Codes:        [name] M [name] 10/14/2016, 5:34 PM   [name], PT, DPT Pager #: [phone] Office #: [phone]

## 2016-10-14 NOTE — Progress Notes (Signed)
Orthopedic Tech Progress Note Patient Details:  Cheryl Good 1946/06/20 [id number]  CPM Right Knee CPM Right Knee: On Right Knee Flexion (Degrees): 90 Right Knee Extension (Degrees): 0 Additional Comments: Foot roll   [name] 10/14/2016, 11:06 AM

## 2016-10-14 NOTE — Anesthesia Procedure Notes (Addendum)
Anesthesia Regional Block:  Adductor canal block  Pre-Anesthetic Checklist: ,, timeout performed, Correct Patient, Correct Site, Correct Laterality, Correct Procedure, Correct Position, site marked, Risks and benefits discussed,  Surgical consent,  Pre-op evaluation,  At surgeon's request and post-op pain management  Laterality: Right  Prep: chloraprep       Needles:  Injection technique: Single-shot  Needle Type: Echogenic Stimulator Needle     Needle Length: 9cm 9 cm Needle Gauge: 21 and 21 G    Additional Needles:  Procedures: ultrasound guided (picture in chart) Adductor canal block Narrative:  Start time: 10/14/2016 9:30 AM End time: 10/14/2016 9:35 AM Injection made incrementally with aspirations every 5 mL.  Performed by: Personally  Anesthesiologist: Lillia Abed  Additional Notes: Monitors applied. Patient sedated. Sterile prep and drape,hand hygiene and sterile gloves were used. Relevant anatomy identified.Needle position confirmed.Local anesthetic injected incrementally after negative aspiration. Local anesthetic spread visualized around nerve(s). Vascular puncture avoided. No complications.The patient tolerated the procedure well.    Lillia Abed MD

## 2016-10-14 NOTE — Anesthesia Preprocedure Evaluation (Signed)
Anesthesia Evaluation  Patient identified by MRN, date of birth, ID band Patient awake    Reviewed: Allergy & Precautions, NPO status , Patient's Chart, lab work & pertinent test results  Airway Mallampati: I  TM Distance: >3 FB Neck ROM: Full    Dental   Pulmonary    Pulmonary exam normal        Cardiovascular hypertension, Pt. on medications Normal cardiovascular exam     Neuro/Psych    GI/Hepatic   Endo/Other  diabetes, Type 2, Oral Hypoglycemic Agents  Renal/GU      Musculoskeletal   Abdominal   Peds  Hematology   Anesthesia Other Findings   Reproductive/Obstetrics                             Anesthesia Physical Anesthesia Plan  ASA: III  Anesthesia Plan: Spinal and MAC   Post-op Pain Management:  Regional for Post-op pain   Induction: Intravenous  Airway Management Planned: Simple Face Mask  Additional Equipment:   Intra-op Plan:   Post-operative Plan:   Informed Consent: I have reviewed the patients History and Physical, chart, labs and discussed the procedure including the risks, benefits and alternatives for the proposed anesthesia with the patient or authorized representative who has indicated his/her understanding and acceptance.     Plan Discussed with: CRNA and Surgeon  Anesthesia Plan Comments:         Anesthesia Quick Evaluation

## 2016-10-14 NOTE — Anesthesia Postprocedure Evaluation (Signed)
Anesthesia Post Note  Patient: Cheryl Good  Procedure(s) Performed: Procedure(s) (LRB): RIGHT TOTAL KNEE ARTHROPLASTY (Right)  Patient location during evaluation: PACU Anesthesia Type: General Level of consciousness: awake and alert Pain management: pain level controlled Vital Signs Assessment: post-procedure vital signs reviewed and stable Respiratory status: spontaneous breathing, nonlabored ventilation, respiratory function stable and patient connected to nasal cannula oxygen Cardiovascular status: blood pressure returned to baseline and stable Postop Assessment: no signs of nausea or vomiting Anesthetic complications: no    Last Vitals:  Vitals:   10/14/16 1215 10/14/16 1248  BP: 140/68 (!) 157/85  Pulse: 72 73  Resp: 18 17  Temp: 36.4 C 36.6 C    Last Pain:  Vitals:   10/14/16 1300  TempSrc:   PainSc: 0-No pain                 Ilyse Tremain DAVID

## 2016-10-14 NOTE — H&P (Signed)
Cheryl Good MRN:  [id number] DOB/SEX:  August 02, 1946/female  CHIEF COMPLAINT:  Painful right Knee  HISTORY: Patient is a 70 y.o. female presented with a history of pain in the right knee. Onset of symptoms was gradual starting a few years ago with gradually worsening course since that time. Patient has been treated conservatively with over-the-counter NSAIDs and activity modification. Patient currently rates pain in the knee at 10 out of 10 with activity. There is pain at night.  PAST MEDICAL HISTORY: Patient Active Problem List   Diagnosis Date Noted  . Postcoital bleeding 02/26/2012  . Vaginal atrophy 01/28/2012   Past Medical History:  Diagnosis Date  . Allergy   . Arthritis   . Chicken pox   . Complication of anesthesia    laryngospasms with amino esters; sensitive to anesthesia  . Diabetes mellitus   . Heart murmur   . Hypertension   . Measles   . Mumps   . Rubella   . Trichomonas   . Yeast infection    Past Surgical History:  Procedure Laterality Date  . BREAST LUMPECTOMY     left lumpectomy  . BUNIONECTOMY Left   . COLONOSCOPY    . orthoscopic knee surgery       MEDICATIONS:   Prescriptions Prior to Admission  Medication Sig Dispense Refill Last Dose  . amLODipine (NORVASC) 2.5 MG tablet Take 1 tablet by mouth daily.   10/13/2016 at Unknown time  . BYDUREON 2 MG SRER 1 injection once a week   Past Month at Unknown time  . celecoxib (CELEBREX) 200 MG capsule Take 200 mg by mouth 2 (two) times daily.   Past Week at Unknown time  . chlorthalidone (HYGROTON) 25 MG tablet Take 25 mg by mouth daily.   10/13/2016 at Unknown time  . fexofenadine (ALLEGRA ALLERGY) 180 MG tablet Take 180 mg by mouth daily.   10/13/2016 at Unknown time  . fluticasone (FLONASE) 50 MCG/ACT nasal spray Place 2 sprays into both nostrils daily.   Past Week at Unknown time  . glimepiride (AMARYL) 2 MG tablet Take 2 mg by mouth daily with breakfast.   Past Week at Unknown time  . Ketotifen  Fumarate (ZADITOR OP) Place 1 drop into both eyes daily as needed.   Past Week at Unknown time  . levalbuterol (XOPENEX HFA) 45 MCG/ACT inhaler Inhale 2 puffs into the lungs every 4 (four) hours as needed for wheezing.   Past Month at Unknown time  . metFORMIN (GLUCOPHAGE) 500 MG tablet Take 500 mg by mouth daily.   Past Week at Unknown time  . Montelukast Sodium (SINGULAIR PO) Take 1 tablet by mouth daily.    Past Week at Unknown time  . UNABLE TO FIND Compound cream pt doesn't know the name. Uses for patients thumb and knees 2 times a day topically   10/13/2016 at Unknown time  . UNABLE TO FIND Allergy vaccines, takes one injection weekly and dosages change when she changes vial   Past Month at Unknown time  . valsartan-hydrochlorothiazide (DIOVAN-HCT) 160-12.5 MG tablet Take 1 tablet by mouth daily.   Past Week at Unknown time  . calcium carbonate 200 MG capsule Take 250 mg by mouth daily. Doesn't know dosage    More than a month at Unknown time  . EPINEPHrine 0.3 mg/0.3 mL IJ SOAJ injection Inject 0.3 mg into the muscle once. As needed for allergic reaction   More than a month at Unknown time    ALLERGIES:  Allergies  Allergen Reactions  . Betadine [Povidone Iodine] Anaphylaxis  . Other Anaphylaxis    Anesthetics AMINO ESTERS "caines"  . Codeine Nausea And Vomiting  . Demerol Other (See Comments)    Hallucinations     REVIEW OF SYSTEMS:  A comprehensive review of systems was negative except for: Musculoskeletal: positive for arthralgias and bone pain   FAMILY HISTORY:   Family History  Problem Relation Age of Onset  . Diabetes Maternal Grandmother   . Cancer Maternal Grandmother   . Hypertension Mother     SOCIAL HISTORY:   Social History  Substance Use Topics  . Smoking status: Never Smoker  . Smokeless tobacco: Never Used  . Alcohol use No     EXAMINATION:  Vital signs in last 24 hours:    There were no vitals taken for this visit.  General Appearance:     Alert, cooperative, no distress, appears stated age  Head:    Normocephalic, without obvious abnormality, atraumatic  Eyes:    PERRL, conjunctiva/corneas clear, EOM's intact, fundi    benign, both eyes  Ears:    Normal TM's and external ear canals, both ears  Nose:   Nares normal, septum midline, mucosa normal, no drainage    or sinus tenderness  Throat:   Lips, mucosa, and tongue normal; teeth and gums normal  Neck:   Supple, symmetrical, trachea midline, no adenopathy;    thyroid:  no enlargement/tenderness/nodules; no carotid   bruit or JVD  Back:     Symmetric, no curvature, ROM normal, no CVA tenderness  Lungs:     Clear to auscultation bilaterally, respirations unlabored  Chest Wall:    No tenderness or deformity   Heart:    Regular rate and rhythm, S1 and S2 normal, no murmur, rub   or gallop  Breast Exam:    No tenderness, masses, or nipple abnormality  Abdomen:     Soft, non-tender, bowel sounds active all four quadrants,    no masses, no organomegaly  Genitalia:    Normal female without lesion, discharge or tenderness  Rectal:    Normal tone, no masses or tenderness;   guaiac negative stool  Extremities:   Extremities normal, atraumatic, no cyanosis or edema  Pulses:   2+ and symmetric all extremities  Skin:   Skin color, texture, turgor normal, no rashes or lesions  Lymph nodes:   Cervical, supraclavicular, and axillary nodes normal  Neurologic:   CNII-XII intact, normal strength, sensation and reflexes    throughout     Musculoskeletal:  ROM 0-120, Ligaments intact,  Imaging Review Plain radiographs demonstrate severe degenerative joint disease of the right knee. The overall alignment is neutral. The bone quality appears to be excellent for age and reported activity level.  Assessment/Plan: Primary osteoarthritis, right knee   The patient history, physical examination and imaging studies are consistent with advanced degenerative joint disease of the right knee. The  patient has failed conservative treatment.  The clearance notes were reviewed.  After discussion with the patient it was felt that Total Knee Replacement was indicated. The procedure,  risks, and benefits of total knee arthroplasty were presented and reviewed. The risks including but not limited to aseptic loosening, infection, blood clots, vascular injury, stiffness, patella tracking problems complications among others were discussed. The patient acknowledged the explanation, agreed to proceed with the plan. Donia Ast 10/14/2016, 6:26 AM

## 2016-10-14 NOTE — Transfer of Care (Signed)
Immediate Anesthesia Transfer of Care Note  Patient: Cheryl Good  Procedure(s) Performed: Procedure(s): RIGHT TOTAL KNEE ARTHROPLASTY (Right)  Patient Location: PACU  Anesthesia Type:Spinal and MAC combined with regional for post-op pain  Level of Consciousness: awake, alert , oriented and patient cooperative  Airway & Oxygen Therapy: Patient Spontanous Breathing and Patient connected to face mask oxygen  Post-op Assessment: Report given to RN, Post -op Vital signs reviewed and stable and Patient moving all extremities  Post vital signs: Reviewed and stable  Last Vitals:  Vitals:   10/14/16 0636 10/14/16 0945  BP: (!) 177/76 121/62  Pulse:  71  Resp:    Temp:  36.3 C    Last Pain:  Vitals:   10/14/16 0626  TempSrc: Oral  PainSc:       Patients Stated Pain Goal: 2 ([id number])  Complications: No apparent anesthesia complications

## 2016-10-14 NOTE — Op Note (Signed)
TOTAL KNEE REPLACEMENT OPERATIVE NOTE:  10/14/2016  1:38 PM  PATIENT:  Cheryl Good  70 y.o. female  PRE-OPERATIVE DIAGNOSIS:  PRIMARY OSTEOARTHRITIS RIGHT KNEE  POST-OPERATIVE DIAGNOSIS:  PRIMARY OSTEOARTHRITIS RIGHT KNEE  PROCEDURE:  Procedure(s): RIGHT TOTAL KNEE ARTHROPLASTY  SURGEON:  Surgeon(s): Vickey Huger, MD  PHYSICIAN ASSISTANT: Carlyon Shadow, [facility]  ANESTHESIA:   spinal  DRAINS: Hemovac  SPECIMEN: None  COUNTS:  Correct  TOURNIQUET:   Total Tourniquet Time Documented: Thigh (Right) - 49 minutes Total: Thigh (Right) - 49 minutes   DICTATION:  Indication for procedure:    The patient is a 70 y.o. female who has failed conservative treatment for PRIMARY OSTEOARTHRITIS RIGHT KNEE.  Informed consent was obtained prior to anesthesia. The risks versus benefits of the operation were explain and in a way the patient can, and did, understand.   On the implant demand matching protocol, this patient scored 10.  Therefore, this patient was not receive a polyethylene insert with vitamin E which is a high demand implant.  Description of procedure:     The patient was taken to the operating room and placed under anesthesia.  The patient was positioned in the usual fashion taking care that all body parts were adequately padded and/or protected.  I foley catheter was not placed.  A tourniquet was applied and the leg prepped and draped in the usual sterile fashion.  The extremity was exsanguinated with the esmarch and tourniquet inflated to 350 mmHg.  Pre-operative range of motion was normal.  The knee was in 5 degree of mild varus.  A midline incision approximately 6-7 inches long was made with a #10 blade.  A new blade was used to make a parapatellar arthrotomy going 2-3 cm into the quadriceps tendon, over the patella, and alongside the medial aspect of the patellar tendon.  A synovectomy was then performed with the #10 blade and forceps. I then elevated the deep MCL off  the medial tibial metaphysis subperiosteally around to the semimembranosus attachment.    I everted the patella and used calipers to measure patellar thickness.  I used the reamer to ream down to appropriate thickness to recreate the native thickness.  I then removed excess bone with the rongeur and sagittal saw.  I used the appropriately sized template and drilled the three lug holes.  I then put the trial in place and measured the thickness with the calipers to ensure recreation of the native thickness.  The trial was then removed and the patella subluxed and the knee brought into flexion.  A homan retractor was place to retract and protect the patella and lateral structures.  A Z-retractor was place medially to protect the medial structures.  The extra-medullary alignment system was used to make cut the tibial articular surface perpendicular to the anamotic axis of the tibia and in 3 degrees of posterior slope.  The cut surface and alignment jig was removed.  I then used the intramedullary alignment guide to make a 3 valgus cut on the distal femur.  I then marked out the epicondylar axis on the distal femur.  The posterior condylar axis measured 8 degrees.  I then used the anterior referencing sizer and measured the femur to be a size 8.  The 4-In-1 cutting block was screwed into place in external rotation matching the posterior condylar angle, making our cuts perpendicular to the epicondylar axis.  Anterior, posterior and chamfer cuts were made with the sagittal saw.  The cutting block and cut pieces  were removed.  A lamina spreader was placed in 90 degrees of flexion.  The ACL, PCL, menisci, and posterior condylar osteophytes were removed.  A 10 mm spacer blocked was found to offer good flexion and extension gap balance after minimal in degree releasing.   The scoop retractor was then placed and the femoral finishing block was pinned in place.  The small sagittal saw was used as well as the lug drill to  finish the femur.  The block and cut surfaces were removed and the medullary canal hole filled with autograft bone from the cut pieces.  The tibia was delivered forward in deep flexion and external rotation.  A size D tray was selected and pinned into place centered on the medial 1/3 of the tibial tubercle.  The reamer and keel was used to prepare the tibia through the tray.    I then trialed with the size 8 femur, size D tibia, a 10 mm insert and the 32 patella.  I had excellent flexion/extension gap balance, excellent patella tracking.  Flexion was full and beyond 120 degrees; extension was zero.  These components were chosen and the staff opened them to me on the back table while the knee was lavaged copiously and the cement mixed.  The soft tissue was infiltrated with 60cc of exparel 1.3% through a 21 gauge needle.  I cemented in the components and removed all excess cement.  The polyethylene tibial component was snapped into place and the knee placed in extension while cement was hardening.  The capsule was infilltrated with 30cc of .25% Marcaine with epinephrine.  A hemovac was place in the joint exiting superolaterally.  A pain pump was place superomedially superficial to the arthrotomy.  Once the cement was hard, the tourniquet was let down.  Hemostasis was obtained.  The arthrotomy was closed with figure-8 #1 vicryl sutures.  The deep soft tissues were closed with #0 vicryls and the subcuticular layer closed with a running #2-0 vicryl.  The skin was reapproximated and closed with skin staples.  The wound was dressed with xeroform, 4 x4's, 2 ABD sponges, a single layer of webril and a TED stocking.   The patient was then awakened, extubated, and taken to the recovery room in stable condition.  BLOOD LOSS:  300cc DRAINS: 1 hemovac, 1 pain catheter COMPLICATIONS:  None.  PLAN OF CARE: Admit to inpatient   PATIENT DISPOSITION:  PACU - hemodynamically stable.   Delay start of Pharmacological  VTE agent (>24hrs) due to surgical blood loss or risk of bleeding:  not applicable  Please fax a copy of this op note to my office at [phone] (please only include page 1 and 2 of the Case Information op note)

## 2016-10-15 ENCOUNTER — Encounter (HOSPITAL_COMMUNITY): Payer: Self-pay | Admitting: Orthopedic Surgery

## 2016-10-15 LAB — CBC
HCT: 30.5 % — ABNORMAL LOW (ref 36.0–46.0)
Hemoglobin: 9.9 g/dL — ABNORMAL LOW (ref 12.0–15.0)
MCH: 28.2 pg (ref 26.0–34.0)
MCHC: 32.5 g/dL (ref 30.0–36.0)
MCV: 86.9 fL (ref 78.0–100.0)
Platelets: 239 10*3/uL (ref 150–400)
RBC: 3.51 MIL/uL — ABNORMAL LOW (ref 3.87–5.11)
RDW: 14.3 % (ref 11.5–15.5)
WBC: 7.1 10*3/uL (ref 4.0–10.5)

## 2016-10-15 LAB — GLUCOSE, CAPILLARY
Glucose-Capillary: 142 mg/dL — ABNORMAL HIGH (ref 65–99)
Glucose-Capillary: 193 mg/dL — ABNORMAL HIGH (ref 65–99)
Glucose-Capillary: 258 mg/dL — ABNORMAL HIGH (ref 65–99)

## 2016-10-15 LAB — BASIC METABOLIC PANEL
Anion gap: 7 (ref 5–15)
BUN: 20 mg/dL (ref 6–20)
CO2: 26 mmol/L (ref 22–32)
Calcium: 8.7 mg/dL — ABNORMAL LOW (ref 8.9–10.3)
Chloride: 108 mmol/L (ref 101–111)
Creatinine, Ser: 1.12 mg/dL — ABNORMAL HIGH (ref 0.44–1.00)
GFR calc Af Amer: 56 mL/min — ABNORMAL LOW (ref >60)
GFR calc non Af Amer: 49 mL/min — ABNORMAL LOW (ref >60)
Glucose, Bld: 159 mg/dL — ABNORMAL HIGH (ref 65–99)
Potassium: 3.6 mmol/L (ref 3.5–5.1)
Sodium: 141 mmol/L (ref 135–145)

## 2016-10-15 MED ORDER — HYDROCODONE-ACETAMINOPHEN 5-325 MG PO TABS: 1.0000 | ORAL_TABLET | ORAL | 0 refills | Status: DC | PRN

## 2016-10-15 MED ORDER — METHOCARBAMOL 500 MG PO TABS: 500.0000 mg | ORAL_TABLET | Freq: Four times a day (QID) | ORAL | 0 refills | Status: DC | PRN

## 2016-10-15 MED ORDER — ASPIRIN 325 MG PO TBEC: 325.0000 mg | DELAYED_RELEASE_TABLET | Freq: Two times a day (BID) | ORAL | 0 refills | Status: DC

## 2016-10-15 NOTE — Progress Notes (Signed)
Physical Therapy Treatment Patient Details Name: Cheryl Good MRN: [id number] DOB: [birth year] Today's Date: 10/15/2016    History of Present Illness Pt admitted for R TKA on 11/27. H/o dm. Fell with RNing earlier in day due to spinal block not wearing off.    PT Comments    Patient is progressing well toward mobility goals. Patient needs to practice stairs next session.     Follow Up Recommendations  Home health PT;Supervision/Assistance - 24 hour     Equipment Recommendations  Rolling walker with 5" wheels;3in1 (PT)    Recommendations for Other Services       Precautions / Restrictions Precautions Precautions: Fall;Knee Precaution Booklet Issued: Yes (comment) Precaution Comments: pt with verbal understanding Restrictions Weight Bearing Restrictions: Yes RLE Weight Bearing: Weight bearing as tolerated LLE Weight Bearing: Weight bearing as tolerated    Mobility  Bed Mobility Overal bed mobility: Needs Assistance Bed Mobility: Sit to Supine     Supine to sit: Supervision Sit to supine: Min guard   General bed mobility comments: min guard for safety; cues for technique  Transfers Overall transfer level: Needs assistance Equipment used: Rolling walker (2 wheeled) Transfers: Sit to/from Stand Sit to Stand: Supervision         General transfer comment: supervision for safety; cues for hand placement  Ambulation/Gait Ambulation/Gait assistance: Supervision Ambulation Distance (Feet): 200 Feet Assistive device: Rolling walker (2 wheeled) Gait Pattern/deviations: Step-through pattern     General Gait Details: mildly antalgic gait however demo'd good step length symmetry and R heel strike and knee flexion during swing phase; cues for posture; little reliance on RW   Stairs            Wheelchair Mobility    Modified Rankin (Stroke Patients Only)       Balance Overall balance assessment: Needs assistance Sitting-balance support: No upper  extremity supported;Feet supported Sitting balance-Leahy Scale: Good     Standing balance support: Bilateral upper extremity supported;During functional activity;No upper extremity supported Standing balance-Leahy Scale: Fair Standing balance comment: Able to stand statically with no UE support for static tasks at sink.                    Cognition Arousal/Alertness: Awake/alert Behavior During Therapy: WFL for tasks assessed/performed Overall Cognitive Status: Within Functional Limits for tasks assessed                      Exercises Total Joint Exercises Quad Sets: AROM;Right;10 reps Heel Slides: AROM;Right;10 reps Hip ABduction/ADduction: AROM;Right;10 reps Straight Leg Raises: AROM;Right;10 reps Long Arc Quad: AROM;Right;10 reps    General Comments        Pertinent Vitals/Pain Pain Assessment: Faces Pain Score: 5  Faces Pain Scale: Hurts little more Pain Location: R knee and ankle (ankle > than knee) Pain Descriptors / Indicators: Aching;Sore Pain Intervention(s): Limited activity within patient's tolerance;Monitored during session;Repositioned;Ice applied;Patient requesting pain meds-RN notified    Home Living Family/patient expects to be discharged to:: Private residence Living Arrangements: Spouse/significant other Available Help at Discharge: Family;Available 24 hours/day Type of Home: House Home Access: Stairs to enter Entrance Stairs-Rails: None Home Layout: One level        Prior Function Level of Independence: Independent          PT Goals (current goals can now be found in the care plan section) Acute Rehab PT Goals Patient Stated Goal: go home and be active PT Goal Formulation: With patient Time For Goal Achievement: 10/21/16  Potential to Achieve Goals: Good Progress towards PT goals: Progressing toward goals    Frequency    7X/week      PT Plan Current plan remains appropriate    Co-evaluation             End of  Session Equipment Utilized During Treatment: Gait belt Activity Tolerance: Patient tolerated treatment well Patient left: in bed;with call bell/phone within reach;with family/visitor present     Time: [id number] PT Time Calculation (min) (ACUTE ONLY): 47 min  Charges:  $Gait Training: 8-22 mins $Therapeutic Exercise: 8-22 mins $Therapeutic Activity: 8-22 mins                    G Codes:      Salina April, PTA Pager: [phone]   10/15/2016, 11:51 AM

## 2016-10-15 NOTE — Progress Notes (Signed)
Physical Therapy Treatment Patient Details Name: Cheryl Good MRN: [id number] DOB: [birth year] Today's Date: 10/15/2016    History of Present Illness Pt admitted for R TKA on 11/27. H/o dm. Fell with RNing earlier in day due to spinal block not wearing off.    PT Comments    Patient is making good progress with PT.  Pt tolerated stair training well and assist only to stabilize RW. Daughter present. From a mobility standpoint anticipate patient will be ready for DC home when medically ready.     Follow Up Recommendations  Home health PT;Supervision/Assistance - 24 hour     Equipment Recommendations  Rolling walker with 5" wheels;3in1 (PT)    Recommendations for Other Services       Precautions / Restrictions Precautions Precautions: Fall;Knee Precaution Booklet Issued: Yes (comment) Precaution Comments: pt with verbal understanding Restrictions Weight Bearing Restrictions: Yes RLE Weight Bearing: Weight bearing as tolerated    Mobility  Bed Mobility Overal bed mobility: Modified Independent Bed Mobility: Supine to Sit           General bed mobility comments: increased time and effort  Transfers Overall transfer level: Needs assistance Equipment used: Rolling walker (2 wheeled) Transfers: Sit to/from Stand Sit to Stand: Supervision         General transfer comment: supervision for safety; cues for hand placement for safe descent to recliner  Ambulation/Gait Ambulation/Gait assistance: Supervision Ambulation Distance (Feet): 225 Feet Assistive device: Rolling walker (2 wheeled) Gait Pattern/deviations: Step-through pattern     General Gait Details: cues for increased R knee flexion during swing phase and for posture   Stairs Stairs: Yes Stairs assistance: Min assist Stair Management: No rails;Step to pattern;Backwards;With walker Number of Stairs:  (2X2) General stair comments: pt educated on sequencing and technique to manage stairs backwards  with RW and pt able to demonstrate good and safe technique with minimal cues second time; daughter present; assist to stabilize RW  Wheelchair Mobility    Modified Rankin (Stroke Patients Only)       Balance     Sitting balance-Leahy Scale: Good       Standing balance-Leahy Scale: Fair                      Cognition Arousal/Alertness: Awake/alert Behavior During Therapy: WFL for tasks assessed/performed Overall Cognitive Status: Within Functional Limits for tasks assessed                      Exercises      General Comments        Pertinent Vitals/Pain Pain Assessment: Faces Faces Pain Scale: Hurts little more Pain Location: R knee and ankle Pain Descriptors / Indicators: Aching Pain Intervention(s): Limited activity within patient's tolerance;Monitored during session;Premedicated before session;Repositioned    Home Living                      Prior Function            PT Goals (current goals can now be found in the care plan section) Acute Rehab PT Goals Patient Stated Goal: go home and be active PT Goal Formulation: With patient Time For Goal Achievement: 10/21/16 Potential to Achieve Goals: Good Progress towards PT goals: Progressing toward goals    Frequency    7X/week      PT Plan Current plan remains appropriate    Co-evaluation             End  of Session Equipment Utilized During Treatment: Gait belt Activity Tolerance: Patient tolerated treatment well Patient left: with call bell/phone within reach;with family/visitor present;in chair     Time: [id number] PT Time Calculation (min) (ACUTE ONLY): 29 min  Charges:  $Gait Training: 8-22 mins $Therapeutic Activity: 8-22 mins                    G Codes:      Salina April, PTA Pager: [phone]   10/15/2016, 4:41 PM

## 2016-10-15 NOTE — Progress Notes (Signed)
Pt resting in bed, daughter at bedside, not complaints at this time

## 2016-10-15 NOTE — Plan of Care (Signed)
Problem: Physical Regulation: Goal: Postoperative complications will be avoided or minimized Outcome: Progressing No post op complications noted  Problem: Safety: Goal: Ability to remain free from injury will improve Outcome: Progressing No fall or injury this shift, fall prevention and safety precautions maintained  Problem: Tissue Perfusion: Goal: Risk factors for ineffective tissue perfusion will decrease Outcome: Progressing No s/s of dvt noted  Problem: Bowel/Gastric: Goal: Will not experience complications related to bowel motility Outcome: Progressing No issues reported

## 2016-10-15 NOTE — Evaluation (Signed)
Occupational Therapy Evaluation/Discharge Patient Details Name: TEAL TREVATHAN MRN: [id number] DOB: 1946-09-03 Today's Date: 10/15/2016    History of Present Illness Pt admitted for R TKA on 11/27. H/o dm. Fell with RNing earlier in day due to spinal block not wearing off.   Clinical Impression   PTA, pt was independent with ADL and IADL and working. Pt currently requires min assist for LB ADL and min guard assist for functional mobility. Pt with a fall yesterday and initially requiring min assist for functional toilet transfers for safety but able to progress to min guard assist for safety. Educated pt and daughter on safe shower transfers, safe use of DME, dressing techniques, fall prevention, and energy conservation post-operatively. Pt and daughter verbalize and demonstrate understanding. Pt will have 24 hour support from family post-acute D/C. Recommend D/C home with 24 hour assistance/supervision and no OT follow up. Recommend 3-in-1 BSC for DME needs. Pt has no further acute OT needs. OT will sign off.    Follow Up Recommendations  No OT follow up;Supervision/Assistance - 24 hour    Equipment Recommendations  3 in 1 bedside comode       Precautions / Restrictions Precautions Precautions: Fall;Knee Precaution Booklet Issued: Yes (comment) Precaution Comments: pt with verbal understanding, knee locked in ext on bed Restrictions Weight Bearing Restrictions: Yes LLE Weight Bearing: Weight bearing as tolerated      Mobility Bed Mobility Overal bed mobility: Needs Assistance Bed Mobility: Supine to Sit     Supine to sit: Supervision        Transfers Overall transfer level: Needs assistance Equipment used: Rolling walker (2 wheeled) Transfers: Sit to/from Stand Sit to Stand: Min assist;Min guard         General transfer comment: Min assist initially as pt with R ankle pain and concern for knee buckling. Progressed to min guard assist.    Balance Overall balance  assessment: Needs assistance Sitting-balance support: No upper extremity supported;Feet supported Sitting balance-Leahy Scale: Good     Standing balance support: Bilateral upper extremity supported;During functional activity;No upper extremity supported Standing balance-Leahy Scale: Fair Standing balance comment: Able to stand statically with no UE support for static tasks at sink.                            ADL Overall ADL's : Needs assistance/impaired     Grooming: Standing;Min guard   Upper Body Bathing: Set up;Sitting   Lower Body Bathing: Minimal assistance;Sit to/from stand   Upper Body Dressing : Set up;Sitting   Lower Body Dressing: Minimal assistance;Sit to/from stand   Toilet Transfer: Min Lobbyist Details (indicate cue type and reason): Min guard due to concerns for R ankle and knee pain. Toileting- Water quality scientist and Hygiene: Min guard;Sit to/from stand   Tub/ Shower Transfer: Min guard;Walk-in shower;Ambulation;Shower seat;Rolling walker   Functional mobility during ADLs: Min guard;Rolling walker General ADL Comments: Pt with knee buckling and fall yesterday resulting in R ankle pain. Educated pt on dressing techniques, safe use of DME, and safe shower transfer.      Vision Vision Assessment?: No apparent visual deficits          Pertinent Vitals/Pain Pain Assessment: 0-10 Pain Score: 5  Pain Location: R knee and ankle Pain Descriptors / Indicators: Aching;Sore;Operative site guarding Pain Intervention(s): Limited activity within patient's tolerance;Monitored during session;Ice applied;Repositioned     Hand Dominance Right   Extremity/Trunk Assessment Upper Extremity Assessment Upper Extremity Assessment:  Overall [facility] for tasks assessed   Lower Extremity Assessment Lower Extremity Assessment: RLE deficits/detail RLE Deficits / Details: Decreased strength and ROM as expected post-operatively. Pain in ankle from  fall yesterday.       Communication Communication Communication: No difficulties   Cognition Arousal/Alertness: Awake/alert Behavior During Therapy: WFL for tasks assessed/performed Overall Cognitive Status: Within Functional Limits for tasks assessed                                Home Living Family/patient expects to be discharged to:: Private residence Living Arrangements: Spouse/significant other Available Help at Discharge: Family;Available 24 hours/day Type of Home: House Home Access: Stairs to enter [employer] of Steps: 3 Entrance Stairs-Rails: None Home Layout: One level     Bathroom Shower/Tub: Occupational psychologist: Standard                Prior Functioning/Environment Level of Independence: Independent                 OT Problem List: Decreased strength;Decreased range of motion;Decreased activity tolerance;Impaired balance (sitting and/or standing);Decreased knowledge of use of DME or AE;Pain   OT Treatment/Interventions:      OT Goals(Current goals can be found in the care plan section) Acute Rehab OT Goals Patient Stated Goal: get my knee working right OT Goal Formulation: With patient/family Time For Goal Achievement: 10/29/16 Potential to Achieve Goals: Good      End of Session Equipment Utilized During Treatment: Gait belt;Rolling walker  Activity Tolerance: Patient tolerated treatment well Patient left: in chair;with call bell/phone within reach;with family/visitor present   Time: [id number] OT Time Calculation (min): 29 min Charges:  OT General Charges $OT Visit: 1 Procedure OT Evaluation $OT Eval Moderate Complexity: 1 Procedure OT Treatments $Self Care/Home Management : 8-22 mins  Norman Herrlich, OTR/L [phone] 10/15/2016, 10:09 AM

## 2016-10-15 NOTE — Discharge Summary (Signed)
SPORTS MEDICINE & JOINT REPLACEMENT   Cheryl Mulch, MD   Carlyon Shadow, PA-C [place], [place], [place]  [address]                             [phone]  PATIENT ID: Cheryl Good        MRN:  [id number]          DOB/AGE: February 14, 1946 / 70 y.o.    DISCHARGE SUMMARY  ADMISSION DATE:    10/14/2016 DISCHARGE DATE:   10/15/2016   ADMISSION DIAGNOSIS: PRIMARY OSTEOARTHRITIS RIGHT KNEE    DISCHARGE DIAGNOSIS:  PRIMARY OSTEOARTHRITIS RIGHT KNEE    ADDITIONAL DIAGNOSIS: Active Problems:   S/P total knee replacement  Past Medical History:  Diagnosis Date  . Allergy   . Arthritis   . Chicken pox   . Complication of anesthesia    laryngospasms with amino esters; sensitive to anesthesia  . Diabetes mellitus   . Heart murmur   . Hypertension   . Measles   . Mumps   . Rubella   . Trichomonas   . Yeast infection     PROCEDURE: Procedure(s): RIGHT TOTAL KNEE ARTHROPLASTY on 10/14/2016  CONSULTS:    HISTORY:  See H&P in chart  HOSPITAL COURSE:  Cheryl Good is a 70 y.o. admitted on 10/14/2016 and found to have a diagnosis of [place].  After appropriate laboratory studies were obtained  they were taken to the operating room on 10/14/2016 and underwent Procedure(s): RIGHT TOTAL KNEE ARTHROPLASTY.   They were given perioperative antibiotics:  Anti-infectives    Start     Dose/Rate Route Frequency Ordered Stop   10/14/16 1400  ceFAZolin (ANCEF) IVPB 1 g/50 mL premix     1 g 100 mL/hr over 30 Minutes Intravenous Every 6 hours 10/14/16 1253 10/14/16 2157   10/14/16 0523  ceFAZolin (ANCEF) IVPB 2g/100 mL premix     2 g 200 mL/hr over 30 Minutes Intravenous On call to O.R. 10/14/16 [id number] 10/14/16 0752    .  Patient given tranexamic acid IV or topical and exparel intra-operatively.  Tolerated the procedure well.    POD# 1: Vital signs were stable.  Patient denied Chest pain, shortness of breath, or calf pain.  Patient was  started on Lovenox 30 mg subcutaneously twice daily at 8am.  Consults to PT, OT, and care management were made.  The patient was weight bearing as tolerated.  CPM was placed on the operative leg 0-90 degrees for 6-8 hours a day. When out of the CPM, patient was placed in the foam block to achieve full extension. Incentive spirometry was taught.  Dressing was changed.       POD #2, Continued  PT for ambulation and exercise program.  IV saline locked.  O2 discontinued.    The remainder of the hospital course was dedicated to ambulation and strengthening.   The patient was discharged on 1 Day Post-Op in  Good condition.  Blood products given:none  DIAGNOSTIC STUDIES: Recent vital signs: Patient Vitals for the past 24 hrs:  BP Temp Temp src Pulse Resp SpO2  10/15/16 0426 (!) 123/55 97.8 F (36.6 C) Oral 60 16 99 %  10/15/16 0039 (!) 121/51 97.7 F (36.5 C) Oral 72 18 100 %  10/14/16 2002 140/64 98.3 F (36.8 C) Oral 74 17 99 %       Recent laboratory studies:  Recent Labs  10/15/16 0454  WBC 7.1  HGB 9.9*  HCT 30.5*  PLT 239    Recent Labs  10/15/16 0454  NA 141  K 3.6  CL 108  CO2 26  BUN 20  CREATININE 1.12*  GLUCOSE 159*  CALCIUM 8.7*   Lab Results  Component Value Date   INR 0.9 10/19/2007     Recent Radiographic Studies :  No results found.  DISCHARGE INSTRUCTIONS: Discharge Instructions    CPM    Complete by:  As directed    Continuous passive motion machine (CPM):      Use the CPM from 0 to 90 for 4-6 hours per day.      You may increase by 10 per day.  You may break it up into 2 or 3 sessions per day.      Use CPM for 2 weeks or until you are told to stop.   Call MD / Call 911    Complete by:  As directed    If you experience chest pain or shortness of breath, CALL 911 and be transported to the hospital emergency room.  If you develope a fever above 101 F, pus (white drainage) or increased drainage or redness at the wound, or calf pain, call your  surgeon's office.   Constipation Prevention    Complete by:  As directed    Drink plenty of fluids.  Prune juice may be helpful.  You may use a stool softener, such as Colace (over the counter) 100 mg twice a day.  Use MiraLax (over the counter) for constipation as needed.   Diet - low sodium heart healthy    Complete by:  As directed    Discharge instructions    Complete by:  As directed    INSTRUCTIONS AFTER JOINT REPLACEMENT   Remove items at home which could result in a fall. This includes throw rugs or furniture in walking pathways ICE to the affected joint every three hours while awake for 30 minutes at a time, for at least the first 3-5 days, and then as needed for pain and swelling.  Continue to use ice for pain and swelling. You may notice swelling that will progress down to the foot and ankle.  This is normal after surgery.  Elevate your leg when you are not up walking on it.   Continue to use the breathing machine you got in the hospital (incentive spirometer) which will help keep your temperature down.  It is common for your temperature to cycle up and down following surgery, especially at night when you are not up moving around and exerting yourself.  The breathing machine keeps your lungs expanded and your temperature down.   DIET:  As you were doing prior to hospitalization, we recommend a well-balanced diet.  DRESSING / WOUND CARE / SHOWERING  You may change your dressing 3-5 days after surgery.  Then change the dressing every day with sterile gauze.  Please use good hand washing techniques before changing the dressing.  Do not use any lotions or creams on the incision until instructed by your surgeon.  ACTIVITY  Increase activity slowly as tolerated, but follow the weight bearing instructions below.   No driving for 6 weeks or until further direction given by your physician.  You cannot drive while taking narcotics.  No lifting or carrying greater than 10 lbs. until further  directed by your surgeon. Avoid periods of inactivity such as sitting longer than an hour when not asleep. This helps prevent blood  clots.  You may return to work once you are authorized by your doctor.     WEIGHT BEARING   Weight bearing as tolerated with assist device (walker, cane, etc) as directed, use it as long as suggested by your surgeon or therapist, typically at least 4-6 weeks.   EXERCISES  Results after joint replacement surgery are often greatly improved when you follow the exercise, range of motion and muscle strengthening exercises prescribed by your doctor. Safety measures are also important to protect the joint from further injury. Any time any of these exercises cause you to have increased pain or swelling, decrease what you are doing until you are comfortable again and then slowly increase them. If you have problems or questions, call your caregiver or physical therapist for advice.   Rehabilitation is important following a joint replacement. After just a few days of immobilization, the muscles of the leg can become weakened and shrink (atrophy).  These exercises are designed to build up the tone and strength of the thigh and leg muscles and to improve motion. Often times heat used for twenty to thirty minutes before working out will loosen up your tissues and help with improving the range of motion but do not use heat for the first two weeks following surgery (sometimes heat can increase post-operative swelling).   These exercises can be done on a training (exercise) mat, on the floor, on a table or on a bed. Use whatever works the best and is most comfortable for you.    Use music or television while you are exercising so that the exercises are a pleasant break in your day. This will make your life better with the exercises acting as a break in your routine that you can look forward to.   Perform all exercises about fifteen times, three times per day or as directed.  You should  exercise both the operative leg and the other leg as well.   Exercises include:   Quad Sets - Tighten up the muscle on the front of the thigh (Quad) and hold for 5-10 seconds.   Straight Leg Raises - With your knee straight (if you were given a brace, keep it on), lift the leg to 60 degrees, hold for 3 seconds, and slowly lower the leg.  Perform this exercise against resistance later as your leg gets stronger.  Leg Slides: Lying on your back, slowly slide your foot toward your buttocks, bending your knee up off the floor (only go as far as is comfortable). Then slowly slide your foot back down until your leg is flat on the floor again.  Angel Wings: Lying on your back spread your legs to the side as far apart as you can without causing discomfort.  Hamstring Strength:  Lying on your back, push your heel against the floor with your leg straight by tightening up the muscles of your buttocks.  Repeat, but this time bend your knee to a comfortable angle, and push your heel against the floor.  You may put a pillow under the heel to make it more comfortable if necessary.   A rehabilitation program following joint replacement surgery can speed recovery and prevent re-injury in the future due to weakened muscles. Contact your doctor or a physical therapist for more information on knee rehabilitation.    CONSTIPATION  Constipation is defined medically as fewer than three stools per week and severe constipation as less than one stool per week.  Even if you have a regular bowel  pattern at home, your normal regimen is likely to be disrupted due to multiple reasons following surgery.  Combination of anesthesia, postoperative narcotics, change in appetite and fluid intake all can affect your bowels.   YOU MUST use at least one of the following options; they are listed in order of increasing strength to get the job done.  They are all available over the counter, and you may need to use some, POSSIBLY even all of  these options:    Drink plenty of fluids (prune juice may be helpful) and high fiber foods Colace 100 mg by mouth twice a day  Senokot for constipation as directed and as needed Dulcolax (bisacodyl), take with full glass of water  Miralax (polyethylene glycol) once or twice a day as needed.  If you have tried all these things and are unable to have a bowel movement in the first 3-4 days after surgery call either your surgeon or your primary doctor.    If you experience loose stools or diarrhea, hold the medications until you stool forms back up.  If your symptoms do not get better within 1 week or if they get worse, check with your doctor.  If you experience "the worst abdominal pain ever" or develop nausea or vomiting, please contact the office immediately for further recommendations for treatment.   ITCHING:  If you experience itching with your medications, try taking only a single pain pill, or even half a pain pill at a time.  You can also use Benadryl over the counter for itching or also to help with sleep.   TED HOSE STOCKINGS:  Use stockings on both legs until for at least 2 weeks or as directed by physician office. They may be removed at night for sleeping.  MEDICATIONS:  See your medication summary on the "After Visit Summary" that nursing will review with you.  You may have some home medications which will be placed on hold until you complete the course of blood thinner medication.  It is important for you to complete the blood thinner medication as prescribed.  PRECAUTIONS:  If you experience chest pain or shortness of breath - call 911 immediately for transfer to the hospital emergency department.   If you develop a fever greater that 101 F, purulent drainage from wound, increased redness or drainage from wound, foul odor from the wound/dressing, or calf pain - CONTACT YOUR SURGEON.                                                   FOLLOW-UP APPOINTMENTS:  If you do not already have  a post-op appointment, please call the office for an appointment to be seen by your surgeon.  Guidelines for how soon to be seen are listed in your "After Visit Summary", but are typically between 1-4 weeks after surgery.  OTHER INSTRUCTIONS:   Knee Replacement:  Do not place pillow under knee, focus on keeping the knee straight while resting. CPM instructions: 0-90 degrees, 2 hours in the morning, 2 hours in the afternoon, and 2 hours in the evening. Place foam block, curve side up under heel at all times except when in CPM or when walking.  DO NOT modify, tear, cut, or change the foam block in any way.  MAKE SURE YOU:  Understand these instructions.  Get help right away if you  are not doing well or get worse.    Thank you for letting us be a part of your medical care team.  It is a privilege we respect greatly.  We hope these instructions will help you stay on track for a fast and full recovery!   Increase activity slowly as tolerated    Complete by:  As directed       DISCHARGE MEDICATIONS:     Medication List    STOP taking these medications   celecoxib 200 MG capsule Commonly known as:  CELEBREX     TAKE these medications   ALLEGRA ALLERGY 180 MG tablet Generic drug:  fexofenadine Take 180 mg by mouth daily.   amLODipine 2.5 MG tablet Commonly known as:  NORVASC Take 1 tablet by mouth daily.   aspirin 325 MG EC tablet Take 1 tablet (325 mg total) by mouth 2 (two) times daily.   BYDUREON 2 MG Srer Generic drug:  Exenatide ER 1 injection once a week   calcium carbonate 200 MG capsule Take 250 mg by mouth daily. Doesn't know dosage   chlorthalidone 25 MG tablet Commonly known as:  HYGROTON Take 25 mg by mouth daily.   EPINEPHrine 0.3 mg/0.3 mL Soaj injection Commonly known as:  EPI-PEN Inject 0.3 mg into the muscle once. As needed for allergic reaction   fluticasone 50 MCG/ACT nasal spray Commonly known as:  FLONASE Place 2 sprays into both nostrils daily.    glimepiride 2 MG tablet Commonly known as:  AMARYL Take 2 mg by mouth daily with breakfast.   HYDROcodone-acetaminophen 5-325 MG tablet Commonly known as:  NORCO/VICODIN Take 1-2 tablets by mouth every 4 (four) hours as needed (breakthrough pain).   levalbuterol 45 MCG/ACT inhaler Commonly known as:  XOPENEX HFA Inhale 2 puffs into the lungs every 4 (four) hours as needed for wheezing.   metFORMIN 500 MG tablet Commonly known as:  GLUCOPHAGE Take 500 mg by mouth daily.   methocarbamol 500 MG tablet Commonly known as:  ROBAXIN Take 1-2 tablets (500-1,000 mg total) by mouth every 6 (six) hours as needed for muscle spasms.   SINGULAIR PO Take 1 tablet by mouth daily.   UNABLE TO FIND Compound cream pt doesn't know the name. Uses for patients thumb and knees 2 times a day topically   UNABLE TO FIND Allergy vaccines, takes one injection weekly and dosages change when she changes vial   valsartan-hydrochlorothiazide 160-12.5 MG tablet Commonly known as:  DIOVAN-HCT Take 1 tablet by mouth daily.   ZADITOR OP Place 1 drop into both eyes daily as needed.            Durable Medical Equipment        Start     Ordered   10/14/16 1254  DME Walker rolling  Once    Question:  Patient needs a walker to treat with the following condition  Answer:  S/P total knee replacement   10/14/16 1253   10/14/16 1254  DME 3 n 1  Once     10/14/16 1253   10/14/16 1254  DME Bedside commode  Once    Question:  Patient needs a bedside commode to treat with the following condition  Answer:  S/P total knee replacement   10/14/16 1253      FOLLOW UP VISIT:   Follow-up Information    [facility] Follow up.   Specialty:  Home Health Services Why:  Someone from [facility] will contact you to arrange  start date and time for therapy. Contact information: [address] [place] [address] [phone]           DISPOSITION: HOME VS. SNF  CONDITION:   Good   Donia Ast 10/15/2016, 3:17 PM

## 2016-10-15 NOTE — Progress Notes (Signed)
Orthopedic Tech Progress Note Patient Details:  Cheryl Good [birth year] [id number]  Patient ID: [name], female   DOB: 04/19/1946, 70 y.o.   MRN: [id number] Applied cpm 0-90  [name] 10/15/2016, 5:55 AM

## 2016-10-15 NOTE — Progress Notes (Signed)
SPORTS MEDICINE AND JOINT REPLACEMENT  Lara Mulch, MD    Carlyon Shadow, PA-C [place], [place], [place]  [address]                             [phone]   PROGRESS NOTE  Subjective:  negative for Chest Pain  negative for Shortness of Breath  negative for Nausea/Vomiting   negative for Calf Pain  negative for Bowel Movement   Tolerating Diet: yes         Patient reports pain as 3 on 0-10 scale.    Objective: Vital signs in last 24 hours:   Patient Vitals for the past 24 hrs:  BP Temp Temp src Pulse Resp SpO2  10/15/16 0426 (!) 123/55 97.8 F (36.6 C) Oral 60 16 99 %  10/15/16 0039 (!) 121/51 97.7 F (36.5 C) Oral 72 18 100 %  10/14/16 2002 140/64 98.3 F (36.8 C) Oral 74 17 99 %  10/14/16 1248 (!) 157/85 97.8 F (36.6 C) Oral 73 17 100 %  10/14/16 1215 140/68 97.6 F (36.4 C) - 72 18 100 %  10/14/16 1132 (!) 138/91 - - 67 17 100 %  10/14/16 1115 (!) 161/87 - - 81 18 96 %  10/14/16 1047 (!) 151/63 - - 66 18 98 %  10/14/16 1045 - - - 68 18 98 %  10/14/16 1015 (!) 149/65 - - 61 17 100 %  10/14/16 0945 121/62 97.4 F (36.3 C) - 71 - 99 %    @flow {1959:LAST@   Intake/Output from previous day:   11/27 0701 - 11/28 0700 In: 1000 [I.V.:1000] Out: 250 [Urine:200]   Intake/Output this shift:   No intake/output data recorded.   Intake/Output      11/27 0701 - 11/28 0700 11/28 0701 - 11/29 0700   I.V. (mL/kg) 1000 (11.8)    Total Intake(mL/kg) 1000 (11.8)    Urine (mL/kg/hr) 200 (0.1)    Blood 50 (0)    Total Output 250     Net +750          Urine Occurrence 4 x       LABORATORY DATA:  Recent Labs  10/15/16 0454  WBC 7.1  HGB 9.9*  HCT 30.5*  PLT 239    Recent Labs  10/15/16 0454  NA 141  K 3.6  CL 108  CO2 26  BUN 20  CREATININE 1.12*  GLUCOSE 159*  CALCIUM 8.7*   Lab Results  Component Value Date   INR 0.9 10/19/2007    Examination:  General appearance: alert, cooperative and no distress Extremities: extremities  normal, atraumatic, no cyanosis or edema  Wound Exam: clean, dry, intact   Drainage:  None: wound tissue dry  Motor Exam: Quadriceps and Hamstrings Intact  Sensory Exam: Superficial Peroneal, Deep Peroneal and Tibial normal   Assessment:    1 Day Post-Op  Procedure(s) (LRB): RIGHT TOTAL KNEE ARTHROPLASTY (Right)  ADDITIONAL DIAGNOSIS:  Active Problems:   S/P total knee replacement     Plan: Physical Therapy as ordered Weight Bearing as Tolerated (WBAT)  DVT Prophylaxis:  Aspirin  DISCHARGE PLAN: Home  DISCHARGE NEEDS: HHPT   Patient doing well, anticipate D/C home today         Donia Ast 10/15/2016, 7:06 AM

## 2016-10-15 NOTE — Care Management Note (Signed)
  Patient Details  Name: Cheryl Good MRN: [id number] Date of Birth: [birth year]  Subjective/Objective:   70 yr old female s/p right total knee arthroplasty.  Action/Plan:Case manager spoke with patient concerning discharge plan and DME needs. Patient was preoperatively setup with [facility], no changes. CM contacted Ebony Hail with [employer] to get Gilford Rile delivered to patient's room, she will take 3in1 and CPM to the home. Patient will have family support at discharge.   Expected Discharge Date:   10/15/16               Expected Discharge Plan:  [place]  In-House Referral:     Discharge planning Services  CM Consult  Post Acute Care Choice:  Durable Medical Equipment, Home Health Choice offered to:     DME Arranged:  3-N-1, Walker rolling, CPM DME Agency:  [facility]  HH Arranged:  [place] Agency:  [facility] (now [facility])  Status of Service:  In process, will continue to follow  If discussed at Long Length of Stay Meetings, dates discussed:    Additional Comments:  Ninfa Meeker, RN 10/15/2016, 3:09 PM

## 2016-10-15 NOTE — Progress Notes (Signed)
Reviewed discharge paper work and medications, IV d/c patient and husband fully understand instructions

## 2016-10-17 NOTE — Progress Notes (Signed)
Patient was being helped to the bedside commode by the NT.  The patient states that her right foot gave way and she started to fall. The NT grabbed the patient by the waist and lowered her to the floor. Patient denies injury at this time.  A post fall huddle was called and the patient assisted back to bed.  Carlyon Shadow, PA-C was paged and notified of the incident  Robet Leu, Tenny Craw, RN

## 2017-06-12 ENCOUNTER — Other Ambulatory Visit: Payer: Self-pay | Admitting: Orthopedic Surgery

## 2017-06-12 DIAGNOSIS — M545 Low back pain, unspecified: Secondary | ICD-10-CM

## 2017-06-12 DIAGNOSIS — M79604 Pain in right leg: Secondary | ICD-10-CM

## 2017-06-15 ENCOUNTER — Ambulatory Visit
Admission: RE | Admit: 2017-06-15 | Discharge: 2017-06-15 | Disposition: A | Discharge status: Home / Self Care | Payer: Medicare Other | Source: Ambulatory Visit | Attending: Orthopedic Surgery | Admitting: Orthopedic Surgery

## 2017-06-15 DIAGNOSIS — M79604 Pain in right leg: Secondary | ICD-10-CM

## 2017-06-15 DIAGNOSIS — M545 Low back pain, unspecified: Secondary | ICD-10-CM

## 2017-10-01 ENCOUNTER — Other Ambulatory Visit: Payer: Self-pay | Admitting: Orthopedic Surgery

## 2017-10-20 ENCOUNTER — Other Ambulatory Visit: Payer: Self-pay

## 2017-10-20 ENCOUNTER — Encounter (HOSPITAL_BASED_OUTPATIENT_CLINIC_OR_DEPARTMENT_OTHER): Payer: Self-pay | Admitting: *Deleted

## 2017-11-04 ENCOUNTER — Encounter (HOSPITAL_BASED_OUTPATIENT_CLINIC_OR_DEPARTMENT_OTHER)
Admission: RE | Admit: 2017-11-04 | Discharge: 2017-11-04 | Disposition: A | Discharge status: Home / Self Care | Payer: Medicare Other | Source: Ambulatory Visit | Attending: Orthopedic Surgery | Admitting: Orthopedic Surgery

## 2017-11-04 DIAGNOSIS — Z888 Allergy status to other drugs, medicaments and biological substances status: Secondary | ICD-10-CM | POA: Diagnosis not present

## 2017-11-04 DIAGNOSIS — I10 Essential (primary) hypertension: Secondary | ICD-10-CM | POA: Diagnosis not present

## 2017-11-04 DIAGNOSIS — M65841 Other synovitis and tenosynovitis, right hand: Secondary | ICD-10-CM | POA: Diagnosis not present

## 2017-11-04 DIAGNOSIS — Z79899 Other long term (current) drug therapy: Secondary | ICD-10-CM | POA: Diagnosis not present

## 2017-11-04 DIAGNOSIS — M65311 Trigger thumb, right thumb: Secondary | ICD-10-CM | POA: Diagnosis not present

## 2017-11-04 DIAGNOSIS — Z885 Allergy status to narcotic agent status: Secondary | ICD-10-CM | POA: Diagnosis not present

## 2017-11-04 DIAGNOSIS — Z7984 Long term (current) use of oral hypoglycemic drugs: Secondary | ICD-10-CM | POA: Diagnosis not present

## 2017-11-04 DIAGNOSIS — E119 Type 2 diabetes mellitus without complications: Secondary | ICD-10-CM | POA: Diagnosis not present

## 2017-11-04 DIAGNOSIS — G5601 Carpal tunnel syndrome, right upper limb: Secondary | ICD-10-CM | POA: Diagnosis not present

## 2017-11-04 DIAGNOSIS — M1990 Unspecified osteoarthritis, unspecified site: Secondary | ICD-10-CM | POA: Diagnosis not present

## 2017-11-04 DIAGNOSIS — R011 Cardiac murmur, unspecified: Secondary | ICD-10-CM | POA: Diagnosis not present

## 2017-11-04 LAB — BASIC METABOLIC PANEL
Anion gap: 8 (ref 5–15)
BUN: 19 mg/dL (ref 6–20)
CO2: 27 mmol/L (ref 22–32)
Calcium: 9.2 mg/dL (ref 8.9–10.3)
Chloride: 104 mmol/L (ref 101–111)
Creatinine, Ser: 1.07 mg/dL — ABNORMAL HIGH (ref 0.44–1.00)
GFR calc Af Amer: 59 mL/min — ABNORMAL LOW (ref >60)
GFR calc non Af Amer: 51 mL/min — ABNORMAL LOW (ref >60)
Glucose, Bld: 91 mg/dL (ref 65–99)
Potassium: 3.7 mmol/L (ref 3.5–5.1)
Sodium: 139 mmol/L (ref 135–145)

## 2017-11-04 NOTE — Progress Notes (Signed)
EKG  reviewed by Dr. Rose, will proceed with surgery as scheduled.  

## 2017-11-05 NOTE — Anesthesia Preprocedure Evaluation (Signed)
Anesthesia Evaluation  Patient identified by MRN, date of birth, ID band Patient awake    Reviewed: Allergy & Precautions, NPO status , Patient's Chart, lab work & pertinent test results  Airway Mallampati: I  TM Distance: >3 FB Neck ROM: Full    Dental no notable dental hx.    Pulmonary    Pulmonary exam normal breath sounds clear to auscultation       Cardiovascular hypertension, Pt. on medications Normal cardiovascular exam Rhythm:Regular Rate:Normal     Neuro/Psych    GI/Hepatic   Endo/Other  diabetes, Type 2, Oral Hypoglycemic Agents  Renal/GU      Musculoskeletal   Abdominal   Peds  Hematology   Anesthesia Other Findings   Reproductive/Obstetrics                             Anesthesia Physical  Anesthesia Plan  ASA: III  Anesthesia Plan: MAC and Bier Block and Bier Block-LIDOCAINE ONLY   Post-op Pain Management:  [place] for Post-op pain   Induction:   PONV Risk Score and Plan: 2 and Ondansetron and Treatment may vary due to age or medical condition  Airway Management Planned: Simple Face Mask, Nasal Cannula and Natural Airway  Additional Equipment:   Intra-op Plan:   Post-operative Plan:   Informed Consent: I have reviewed the patients History and Physical, chart, labs and discussed the procedure including the risks, benefits and alternatives for the proposed anesthesia with the patient or authorized representative who has indicated his/her understanding and acceptance.     Plan Discussed with: CRNA, Surgeon and Anesthesiologist  Anesthesia Plan Comments:         Anesthesia Quick Evaluation

## 2017-11-06 ENCOUNTER — Ambulatory Visit (HOSPITAL_BASED_OUTPATIENT_CLINIC_OR_DEPARTMENT_OTHER): Payer: Medicare Other | Admitting: Anesthesiology

## 2017-11-06 ENCOUNTER — Other Ambulatory Visit: Payer: Self-pay

## 2017-11-06 ENCOUNTER — Ambulatory Visit (HOSPITAL_BASED_OUTPATIENT_CLINIC_OR_DEPARTMENT_OTHER)
Admission: RE | Admit: 2017-11-06 | Discharge: 2017-11-06 | Disposition: A | Discharge status: Home / Self Care | Payer: Medicare Other | Source: Ambulatory Visit | Attending: Orthopedic Surgery | Admitting: Orthopedic Surgery

## 2017-11-06 ENCOUNTER — Encounter (HOSPITAL_BASED_OUTPATIENT_CLINIC_OR_DEPARTMENT_OTHER): Payer: Self-pay | Admitting: Anesthesiology

## 2017-11-06 ENCOUNTER — Encounter (HOSPITAL_BASED_OUTPATIENT_CLINIC_OR_DEPARTMENT_OTHER)
Admission: RE | Disposition: A | Discharge status: Home / Self Care | Payer: Self-pay | Source: Ambulatory Visit | Attending: Orthopedic Surgery

## 2017-11-06 DIAGNOSIS — Z79899 Other long term (current) drug therapy: Secondary | ICD-10-CM | POA: Insufficient documentation

## 2017-11-06 DIAGNOSIS — Z888 Allergy status to other drugs, medicaments and biological substances status: Secondary | ICD-10-CM | POA: Insufficient documentation

## 2017-11-06 DIAGNOSIS — E119 Type 2 diabetes mellitus without complications: Secondary | ICD-10-CM | POA: Insufficient documentation

## 2017-11-06 DIAGNOSIS — M65311 Trigger thumb, right thumb: Secondary | ICD-10-CM | POA: Insufficient documentation

## 2017-11-06 DIAGNOSIS — G5601 Carpal tunnel syndrome, right upper limb: Secondary | ICD-10-CM | POA: Diagnosis not present

## 2017-11-06 DIAGNOSIS — M1990 Unspecified osteoarthritis, unspecified site: Secondary | ICD-10-CM | POA: Insufficient documentation

## 2017-11-06 DIAGNOSIS — I10 Essential (primary) hypertension: Secondary | ICD-10-CM | POA: Insufficient documentation

## 2017-11-06 DIAGNOSIS — Z7984 Long term (current) use of oral hypoglycemic drugs: Secondary | ICD-10-CM | POA: Insufficient documentation

## 2017-11-06 DIAGNOSIS — R011 Cardiac murmur, unspecified: Secondary | ICD-10-CM | POA: Insufficient documentation

## 2017-11-06 DIAGNOSIS — M65841 Other synovitis and tenosynovitis, right hand: Secondary | ICD-10-CM | POA: Diagnosis not present

## 2017-11-06 DIAGNOSIS — Z885 Allergy status to narcotic agent status: Secondary | ICD-10-CM | POA: Insufficient documentation

## 2017-11-06 HISTORY — PX: CARPAL TUNNEL RELEASE: SHX101

## 2017-11-06 HISTORY — PX: TRIGGER FINGER RELEASE: SHX641

## 2017-11-06 LAB — GLUCOSE, CAPILLARY
Glucose-Capillary: 123 mg/dL — ABNORMAL HIGH (ref 65–99)
Glucose-Capillary: 90 mg/dL (ref 65–99)

## 2017-11-06 SURGERY — CARPAL TUNNEL RELEASE
Anesthesia: Monitor Anesthesia Care | Site: Wrist | Laterality: Right

## 2017-11-06 MED ORDER — HYDROCODONE-ACETAMINOPHEN 5-325 MG PO TABS: 1.0000 | ORAL_TABLET | Freq: Four times a day (QID) | ORAL | 0 refills | Status: DC | PRN

## 2017-11-06 MED ORDER — FENTANYL CITRATE (PF) 100 MCG/2ML IJ SOLN: 25.0000 ug | INTRAMUSCULAR | Status: DC | PRN

## 2017-11-06 MED ORDER — MIDAZOLAM HCL 2 MG/2ML IJ SOLN: 1.0000 mg | INTRAMUSCULAR | Status: DC | PRN

## 2017-11-06 MED ORDER — LACTATED RINGERS IV SOLN
INTRAVENOUS | Status: DC
  Administered 2017-11-06: 08:00:00 via INTRAVENOUS

## 2017-11-06 MED ORDER — BUPIVACAINE HCL (PF) 0.25 % IJ SOLN
INTRAMUSCULAR | Status: DC | PRN
  Administered 2017-11-06: 8 mL

## 2017-11-06 MED ORDER — LIDOCAINE HCL (PF) 0.5 % IJ SOLN
INTRAMUSCULAR | Status: DC | PRN
  Administered 2017-11-06: 30 mL via INTRAVENOUS

## 2017-11-06 MED ORDER — SCOPOLAMINE 1 MG/3DAYS TD PT72: 1.0000 | MEDICATED_PATCH | Freq: Once | TRANSDERMAL | Status: DC | PRN

## 2017-11-06 MED ORDER — MEPERIDINE HCL 25 MG/ML IJ SOLN: 6.2500 mg | INTRAMUSCULAR | Status: DC | PRN

## 2017-11-06 MED ORDER — FENTANYL CITRATE (PF) 100 MCG/2ML IJ SOLN: 50.0000 ug | INTRAMUSCULAR | Status: DC | PRN

## 2017-11-06 MED ORDER — 0.9 % SODIUM CHLORIDE (POUR BTL) OPTIME
TOPICAL | Status: DC | PRN
  Administered 2017-11-06: 200 mL

## 2017-11-06 MED ORDER — ONDANSETRON HCL 4 MG/2ML IJ SOLN
INTRAMUSCULAR | Status: DC | PRN
  Administered 2017-11-06: 4 mg via INTRAVENOUS

## 2017-11-06 MED ORDER — CEFAZOLIN SODIUM-DEXTROSE 2-4 GM/100ML-% IV SOLN
INTRAVENOUS | Status: AC
  Filled 2017-11-06: qty 100

## 2017-11-06 MED ORDER — FENTANYL CITRATE (PF) 100 MCG/2ML IJ SOLN
INTRAMUSCULAR | Status: AC
  Filled 2017-11-06: qty 2

## 2017-11-06 MED ORDER — FENTANYL CITRATE (PF) 100 MCG/2ML IJ SOLN
INTRAMUSCULAR | Status: DC | PRN
  Administered 2017-11-06 (×2): 50 ug via INTRAVENOUS

## 2017-11-06 MED ORDER — ONDANSETRON HCL 4 MG/2ML IJ SOLN
INTRAMUSCULAR | Status: AC
  Filled 2017-11-06: qty 2

## 2017-11-06 MED ORDER — CEFAZOLIN SODIUM-DEXTROSE 2-4 GM/100ML-% IV SOLN
2.0000 g | INTRAVENOUS | Status: AC
  Administered 2017-11-06: 2 g via INTRAVENOUS

## 2017-11-06 MED ORDER — PROPOFOL 500 MG/50ML IV EMUL
INTRAVENOUS | Status: AC
  Filled 2017-11-06: qty 50

## 2017-11-06 MED ORDER — LIDOCAINE 2% (20 MG/ML) 5 ML SYRINGE
INTRAMUSCULAR | Status: AC
  Filled 2017-11-06: qty 5

## 2017-11-06 MED ORDER — PROPOFOL 500 MG/50ML IV EMUL
INTRAVENOUS | Status: DC | PRN
  Administered 2017-11-06: 25 ug/kg/min via INTRAVENOUS

## 2017-11-06 MED ORDER — CHLORHEXIDINE GLUCONATE 4 % EX LIQD: 60.0000 mL | Freq: Once | CUTANEOUS | Status: DC

## 2017-11-06 MED ORDER — DEXAMETHASONE SODIUM PHOSPHATE 10 MG/ML IJ SOLN
INTRAMUSCULAR | Status: AC
  Filled 2017-11-06: qty 1

## 2017-11-06 SURGICAL SUPPLY — 38 items
BANDAGE COBAN STERILE 2 (GAUZE/BANDAGES/DRESSINGS) ×2 IMPLANT
BLADE SURG 15 STRL LF DISP TIS (BLADE) ×2 IMPLANT
BLADE SURG 15 STRL SS (BLADE) ×3
BNDG CMPR 9X4 STRL LF SNTH (GAUZE/BANDAGES/DRESSINGS)
BNDG COHESIVE 3X5 TAN STRL LF (GAUZE/BANDAGES/DRESSINGS) ×3 IMPLANT
BNDG ESMARK 4X9 LF (GAUZE/BANDAGES/DRESSINGS) IMPLANT
BNDG GAUZE ELAST 4 BULKY (GAUZE/BANDAGES/DRESSINGS) ×3 IMPLANT
CHLORAPREP W/TINT 26ML (MISCELLANEOUS) ×3 IMPLANT
CORD BIPOLAR FORCEPS 12FT (ELECTRODE) ×3 IMPLANT
COVER BACK TABLE 60X90IN (DRAPES) ×3 IMPLANT
COVER MAYO STAND STRL (DRAPES) ×3 IMPLANT
CUFF TOURNIQUET SINGLE 18IN (TOURNIQUET CUFF) ×3 IMPLANT
DECANTER SPIKE VIAL GLASS SM (MISCELLANEOUS) IMPLANT
DRAPE EXTREMITY T 121X128X90 (DRAPE) ×3 IMPLANT
DRAPE SURG 17X23 STRL (DRAPES) ×3 IMPLANT
DRSG PAD ABDOMINAL 8X10 ST (GAUZE/BANDAGES/DRESSINGS) ×3 IMPLANT
GAUZE SPONGE 4X4 12PLY STRL (GAUZE/BANDAGES/DRESSINGS) ×3 IMPLANT
GAUZE XEROFORM 1X8 LF (GAUZE/BANDAGES/DRESSINGS) ×3 IMPLANT
GLOVE BIOGEL PI IND STRL 7.0 (GLOVE) IMPLANT
GLOVE BIOGEL PI IND STRL 8.5 (GLOVE) ×2 IMPLANT
GLOVE BIOGEL PI INDICATOR 7.0 (GLOVE) ×2
GLOVE BIOGEL PI INDICATOR 8.5 (GLOVE) ×1
GLOVE ECLIPSE 6.5 STRL STRAW (GLOVE) ×1 IMPLANT
GLOVE SURG ORTHO 8.0 STRL STRW (GLOVE) ×3 IMPLANT
GOWN STRL REUS W/ TWL LRG LVL3 (GOWN DISPOSABLE) ×2 IMPLANT
GOWN STRL REUS W/TWL LRG LVL3 (GOWN DISPOSABLE) ×3
GOWN STRL REUS W/TWL XL LVL3 (GOWN DISPOSABLE) ×3 IMPLANT
NDL PRECISIONGLIDE 27X1.5 (NEEDLE) ×2 IMPLANT
NEEDLE PRECISIONGLIDE 27X1.5 (NEEDLE) ×3 IMPLANT
NS IRRIG 1000ML POUR BTL (IV SOLUTION) ×3 IMPLANT
PACK BASIN DAY SURGERY FS (CUSTOM PROCEDURE TRAY) ×3 IMPLANT
STOCKINETTE 4X48 STRL (DRAPES) ×3 IMPLANT
SUT ETHILON 4 0 PS 2 18 (SUTURE) ×3 IMPLANT
SUT VICRYL 4-0 PS2 18IN ABS (SUTURE) IMPLANT
SYR BULB 3OZ (MISCELLANEOUS) ×3 IMPLANT
SYR CONTROL 10ML LL (SYRINGE) ×3 IMPLANT
TOWEL OR 17X24 6PK STRL BLUE (TOWEL DISPOSABLE) ×6 IMPLANT
UNDERPAD 30X30 (UNDERPADS AND DIAPERS) ×3 IMPLANT

## 2017-11-06 NOTE — Op Note (Signed)
NAMEJAMIAYA, Good NO.:  [id number]  MEDICAL RECORD NO.:  [id number]  LOCATION:                                 FACILITY:  PHYSICIAN:  Daryll Brod, M.D.            DATE OF BIRTH:  DATE OF PROCEDURE:  11/06/2017 DATE OF DISCHARGE:                              OPERATIVE REPORT   PREOPERATIVE DIAGNOSES:  Carpal tunnel syndrome, right hand.  Stenosing tenosynovitis, right thumb.  POSTOPERATIVE DIAGNOSES:  Carpal tunnel syndrome, right hand.  Stenosing tenosynovitis, right thumb.  OPERATION:  Decompression of median nerve, right wrist, with release of A1 pulley, right thumb.  SURGEON:  Daryll Brod, MD.  ASSISTANT:  None.  ANESTHESIA:  Forearm IV regional with IV sedation and local infiltration.  PLACE OF SURGERY:  [facility].  HISTORY:  The patient is a 71 year old female with a history of carpal tunnel syndrome, nerve conductions positive, and triggering of right thumb, neither of which has responded to conservative treatment.  She has elected to undergo surgical decompression to the median nerve along with release of the A1 pulley of the right thumb.  Pre, peri, and postoperative courses have been discussed along with risks and complications.  She is aware that there is no guarantee to the surgery, the possibility of infection, recurrence, injury to arteries, nerves, and tendons, incomplete relief of symptoms, and dystrophy.  In the preoperative area, the patient is seen, the extremity marked by both patient and surgeon, antibiotic given.  DESCRIPTION OF PROCEDURE:  The patient was brought to the operating room where a forearm-based IV regional anesthetic was carried out without difficulty under the direction of the Anesthesia Department.  She was prepped using ChloraPrep in supine position with the right arm free.  A 3-minute dry time was allowed and time-out was taken confirming the patient and procedure.  The thumb was attended to first.   A transverse incision was made over the A1 pulley, carried down through subcutaneous tissue.  The neurovascular structures identified and protected with retractors.  The A1 pulley was found to be markedly thickened and this was released on its radial aspect protecting the oblique pulley.  The tenosynovial tissue proximally was separated with blunt dissection.  The thumb was placed through a full range motion and no further triggering was noted.  The wound was irrigated and closed with interrupted 4-0 nylon sutures.  A separate incision was then made longitudinally in the right palm, carried down through subcutaneous tissue.  Bleeders were again electrocauterized with bipolar.  The palmar fascia was split.  The superficial palmar arch was identified.  The flexor tendon to the ring and little finger was identified.  Retractors were placed protecting the median nerve radially and the ulnar nerve ulnarly.  The flexor retinaculum was then released on its ulnar border.  A right angle and Sewell retractor were placed between skin and forearm fascia.  The deep structures were dissected free with blunt dissection.  Blunt scissors were then used to transect the proximal aspect of the flexor retinaculum, distal forearm fascia for approximately 2 cm proximal to the wrist crease under direct vision.  The canal was explored.  An  area of compression to the nerve was apparent.  The motor branch entered into muscle distally.  The wound was copiously irrigated with saline.  The skin was closed with interrupted 4-0 nylon sutures.  A local infiltration with 0.25% bupivacaine without epinephrine was given. Approximately 8 mL was used.  A sterile compressive dressing with the fingers and thumb free was applied.  On deflation of the tourniquet, all fingers immediately pinked.  She was taken to the recovery room for observation in satisfactory condition.  She will be discharged to home to return to the [facility] in 1 week, on [facility].          ______________________________ Daryll Brod, M.D.     GK/MEDQ  D:  11/06/2017  T:  11/06/2017  Job:  [id number]

## 2017-11-06 NOTE — Anesthesia Postprocedure Evaluation (Signed)
Anesthesia Post Note  Patient: Cheryl Good  Procedure(s) Performed: RIGHT CARPAL TUNNEL RELEASE (Right Wrist) RIGHT RELEASE TRIGGER FINGER/A-1 PULLEY (Right Hand)     Patient location during evaluation: PACU Anesthesia Type: MAC Level of consciousness: awake and alert Pain management: pain level controlled Vital Signs Assessment: post-procedure vital signs reviewed and stable Respiratory status: spontaneous breathing, nonlabored ventilation, respiratory function stable and patient connected to nasal cannula oxygen Cardiovascular status: stable and blood pressure returned to baseline Postop Assessment: no apparent nausea or vomiting Anesthetic complications: no    Last Vitals:  Vitals:   11/06/17 0941 11/06/17 1009  BP: (!) 145/57 (!) 176/72  Pulse: (!) 53 (!) 59  Resp: 12 18  Temp:  36.4 C  SpO2: 100% 98%    Last Pain:  Vitals:   11/06/17 1009  TempSrc:   PainSc: 0-No pain                 Marieke Lubke

## 2017-11-06 NOTE — H&P (Signed)
Cheryl Good is an 71 y.o. female.   Chief Complaint: numbness rt hand and catching right thumb [facility]:ENIDPO is a 71 yo female with  stenosing tenosynovitis right thumb and carpal tunnel syndrome right hand.  Her thumb is locked. This has been injected on 2 occasions. He has been wearing a comfort cool splint. She complains of numbness tingling thumb through middle fingers. This been present for several years. Gradually getting worse. Occasionally awakens her at night. She has no history of injury to her neck. She is not taking anything for this. She states that she has pain in her wrist which gradually increased up her forearm this is aching in nature. Mild in intensity.She has had her nerve conductions done by Dr. Thereasa Parkin. This reveals a carpal tunnel syndrome with a motor delay of 4.4 sensory delay of 4.2 with an enlargement on ultrasound to 15 mm at the wrist          Past Medical History:  Diagnosis Date  . Allergy   . Arthritis   . Chicken pox   . Complication of anesthesia    laryngospasms with amino esters; sensitive to anesthesia  . Diabetes mellitus   . Heart murmur   . Hypertension   . Measles   . Mumps   . Rubella   . Trichomonas   . Yeast infection     Past Surgical History:  Procedure Laterality Date  . BREAST LUMPECTOMY     left lumpectomy  . BUNIONECTOMY Left   . COLONOSCOPY    . orthoscopic knee surgery    . 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 Diabetes Maternal Grandmother   . Cancer Maternal Grandmother   . Hypertension Mother    Social History:  reports that  has never smoked. she has never used smokeless tobacco. She reports that she does not drink alcohol or use drugs.  Allergies:  Allergies  Allergen Reactions  . Betadine [Povidone Iodine] Anaphylaxis   . Other Anaphylaxis    Anesthetics AMINO ESTERS "caines"  . Codeine Nausea And Vomiting  . Demerol Other (See Comments)    Hallucinations     No medications prior to admission.    Results for orders placed or performed during the hospital encounter of 11/06/17 (from the past 48 hour(s))  Basic metabolic panel     Status: Abnormal   Collection Time: 11/04/17  3:30 PM  Result Value Ref Range   Sodium 139 135 - 145 mmol/L   Potassium 3.7 3.5 - 5.1 mmol/L   Chloride 104 101 - 111 mmol/L   CO2 27 22 - 32 mmol/L   Glucose, Bld 91 65 - 99 mg/dL   BUN 19 6 - 20 mg/dL   Creatinine, Ser 1.07 (H) 0.44 - 1.00 mg/dL   Calcium 9.2 8.9 - 10.3 mg/dL   GFR calc non Af Amer 51 (L) >60 mL/min   GFR calc Af Amer 59 (L) >60 mL/min    Comment: (NOTE) The eGFR has been calculated using the CKD EPI equation. This calculation has not been validated in all clinical situations. eGFR's persistently <60 mL/min signify possible Chronic Kidney Disease.    Anion gap 8 5 - 15    No results found.   Pertinent items are noted in HPI.  Height _0  (1.549 m), weight  84.4 kg (186 lb).  General appearance: alert, cooperative and appears stated age Head: Normocephalic, without obvious abnormality Neck: no JVD Resp: clear to auscultation bilaterally Cardio: regular rate and rhythm, S1, S2 normal, no murmur, click, rub or gallop GI: soft, non-tender; bowel sounds normal; no masses,  no organomegaly Extremities: numbness rt hand and catching right thumb Pulses: 2+ and symmetric Skin: Skin color, texture, turgor normal. No rashes or lesions Neurologic: Grossly normal Incision/Wound: na  Assessment/Plan Assessment:  1. Trigger thumb of right hand  2. Carpal Tunnel. syndrome right hand.      Plan: Discussed possibility of surgical release both the carpal tunnel and the A1 pulley of her right thumb. Pre-peri-postoperative course are discussed along with risks and complications. She is aware that  there is no guarantee to the surgery the possibility of infection recurrence injury to arteries nerves tendons complete relief symptoms dystrophy. Questions are encouraged and answered to her satisfaction. She would like to proceed to have this done. This be scheduled as an outpatient under general anesthesia.      [name] 11/06/2017, 5:39 AM

## 2017-11-06 NOTE — Brief Op Note (Signed)
11/06/2017  9:09 AM  PATIENT:  Cleda Mccreedy Vogt  71 y.o. female  PRE-OPERATIVE DIAGNOSIS:  RIGHT CARPAL TUNNEL SYNDROME RIGHT, TRIGGER THUMB  POST-OPERATIVE DIAGNOSIS:  RIGHT CARPAL TUNNEL SYNDROME RIGHT, TRIGGER THUMB  PROCEDURE:  Procedure(s): RIGHT CARPAL TUNNEL RELEASE (Right) RIGHT RELEASE TRIGGER FINGER/A-1 PULLEY (Right)  SURGEON:  Surgeon(s) and Role:    * Daryll Brod, MD - Primary  PHYSICIAN ASSISTANT:   ASSISTANTS: none   ANESTHESIA:   local, regional and IV sedation  EBL:  2 mL   BLOOD ADMINISTERED:none  DRAINS: none   LOCAL MEDICATIONS USED:  BUPIVICAINE   SPECIMEN:  No Specimen  DISPOSITION OF SPECIMEN:  N/A  COUNTS:  YES  TOURNIQUET:   Total Tourniquet Time Documented: Forearm (Right) - 27 minutes Total: Forearm (Right) - 27 minutes   DICTATION: .Other Dictation: Dictation Number [id number]  PLAN OF CARE: Discharge to home after PACU  PATIENT DISPOSITION:  PACU - hemodynamically stable.

## 2017-11-06 NOTE — Discharge Instructions (Addendum)
[facility] Instructions °Hand Surgery ° °Wound Care: °Keep your hand elevated above the level of your heart.  Do not allow it to dangle by your side.  Keep the dressing dry and do not remove it unless your doctor advises you to do so.  He will usually change it at the time of your post-op visit.  Moving your fingers is advised to stimulate circulation but will depend on the site of your surgery.  If you have a splint applied, your doctor will advise you regarding movement. ° °Activity: °Do not drive or operate machinery today.  Rest today and then you may return to your normal activity and work as indicated by your physician. ° °Diet:  °Drink liquids today or eat a light diet.  You may resume a regular diet tomorrow.   ° °General expectations: °Pain for two to three days. °Fingers may become slightly swollen. ° °Call your doctor if any of the following occur: °Severe pain not relieved by pain medication. °Elevated temperature. °Dressing soaked with blood. °Inability to move fingers. °White or bluish color to fingers. ° ° °Post Anesthesia Home Care Instructions ° °Activity: °Get plenty of rest for the remainder of the day. A responsible individual must stay with you for 24 hours following the procedure.  °For the next 24 hours, DO NOT: °-Drive a car °-Operate machinery °-Drink alcoholic beverages °-Take any medication unless instructed by your physician °-Make any legal decisions or sign important papers. ° °Meals: °Start with liquid foods such as gelatin or soup. Progress to regular foods as tolerated. Avoid greasy, spicy, heavy foods. If nausea and/or vomiting occur, drink only clear liquids until the nausea and/or vomiting subsides. Call your physician if vomiting continues. ° °Special Instructions/Symptoms: °Your throat may feel dry or sore from the anesthesia or the breathing tube placed in your throat during surgery. If this causes discomfort, gargle with warm salt water. The discomfort should disappear within  24 hours. ° °If you had a scopolamine patch placed behind your ear for the management of post- operative nausea and/or vomiting: ° °1. The medication in the patch is effective for 72 hours, after which it should be removed.  Wrap patch in a tissue and discard in the trash. Wash hands thoroughly with soap and water. °2. You may remove the patch earlier than 72 hours if you experience unpleasant side effects which may include dry mouth, dizziness or visual disturbances. °3. Avoid touching the patch. Wash your hands with soap and water after contact with the patch. °  ° °Post Anesthesia Home Care Instructions ° °Activity: °Get plenty of rest for the remainder of the day. A responsible individual must stay with you for 24 hours following the procedure.  °For the next 24 hours, DO NOT: °-Drive a car °-Operate machinery °-Drink alcoholic beverages °-Take any medication unless instructed by your physician °-Make any legal decisions or sign important papers. ° °Meals: °Start with liquid foods such as gelatin or soup. Progress to regular foods as tolerated. Avoid greasy, spicy, heavy foods. If nausea and/or vomiting occur, drink only clear liquids until the nausea and/or vomiting subsides. Call your physician if vomiting continues. ° °Special Instructions/Symptoms: °Your throat may feel dry or sore from the anesthesia or the breathing tube placed in your throat during surgery. If this causes discomfort, gargle with warm salt water. The discomfort should disappear within 24 hours. ° °If you had a scopolamine patch placed behind your ear for the management of post- operative nausea and/or vomiting: ° °  1. The medication in the patch is effective for 72 hours, after which it should be removed.  Wrap patch in a tissue and discard in the trash. Wash hands thoroughly with soap and water. °2. You may remove the patch earlier than 72 hours if you experience unpleasant side effects which may include dry mouth, dizziness or visual  disturbances. °3. Avoid touching the patch. Wash your hands with soap and water after contact with the patch. °  ° °

## 2017-11-06 NOTE — Op Note (Signed)
Dictation Number [id number]

## 2017-11-06 NOTE — Transfer of Care (Signed)
Immediate Anesthesia Transfer of Care Note  Patient: Cheryl Good  Procedure(s) Performed: RIGHT CARPAL TUNNEL RELEASE (Right Wrist) RIGHT RELEASE TRIGGER FINGER/A-1 PULLEY (Right Hand)  Patient Location: PACU  Anesthesia Type:Bier block  Level of Consciousness: awake and patient cooperative  Airway & Oxygen Therapy: Patient Spontanous Breathing and Patient connected to face mask oxygen  Post-op Assessment: Report given to RN and Post -op Vital signs reviewed and stable  Post vital signs: Reviewed and stable  Last Vitals:  Vitals:   11/06/17 0740  BP: (!) 176/74  Pulse: 64  Resp: 18  Temp: 36.7 C  SpO2: 100%    Last Pain:  Vitals:   11/06/17 0740  TempSrc: Oral  PainSc: 1       Patients Stated Pain Goal: 1 (18/59/09 3112)  Complications: No apparent anesthesia complications

## 2017-11-07 ENCOUNTER — Encounter (HOSPITAL_BASED_OUTPATIENT_CLINIC_OR_DEPARTMENT_OTHER): Payer: Self-pay | Admitting: Orthopedic Surgery

## 2017-12-05 ENCOUNTER — Other Ambulatory Visit: Payer: Self-pay | Admitting: Orthopedic Surgery

## 2017-12-05 DIAGNOSIS — M25561 Pain in right knee: Secondary | ICD-10-CM

## 2017-12-08 ENCOUNTER — Ambulatory Visit
Admission: RE | Admit: 2017-12-08 | Discharge: 2017-12-08 | Disposition: A | Discharge status: Home / Self Care | Payer: Medicare Other | Source: Ambulatory Visit | Attending: Orthopedic Surgery | Admitting: Orthopedic Surgery

## 2017-12-08 DIAGNOSIS — M25561 Pain in right knee: Secondary | ICD-10-CM

## 2018-05-02 ENCOUNTER — Encounter: Payer: Self-pay | Admitting: Interventional Cardiology

## 2018-09-02 NOTE — Progress Notes (Signed)
Cardiology Office Note:    Date:  09/04/2018   ID:  Cheryl Good, DOB 09-27-1946, MRN [id number]  PCP:  Lucianne Lei, MD  Cardiologist:  Sinclair Grooms, MD   Referring MD: Lucianne Lei, MD   Chief Complaint  Patient presents with  . Hypertension  . Hyperlipidemia  . Advice Only    Bradycardia    History of Present Illness:    Cheryl Good is a 72 y.o. female with a hx of hypertension, DM II, hyperlipidemia who is referred by Dr. Criss Rosales for evaluation of bradycardia.  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  She does monitor her vital signs quite frequently and has been noticing heart rates at times down in the 40s.  Heart rate is usually run in the 50-60 range.  She has no symptoms.  She denies orthopnea, PND, and exertional fatigue.  She has not had chest pain.  There is a family history of vascular disease including her mother and father (heart attack and stroke respectively).  She is a long-standing diabetic.  She has essential hypertension.  She has never been a smoker.  No history of sleep apnea.    Past Medical History:  Diagnosis Date  . Allergy   . Arthritis   . Chicken pox   . Complication of anesthesia    laryngospasms with amino esters; sensitive to anesthesia  . Diabetes mellitus   . Heart murmur   . Hypertension   . Measles   . Mumps   . Rubella   . Trichomonas   . Yeast infection     Past Surgical History:  Procedure Laterality Date  . BREAST LUMPECTOMY     left lumpectomy  . BUNIONECTOMY Left   . CARPAL TUNNEL RELEASE Right 11/06/2017   Procedure: RIGHT CARPAL TUNNEL RELEASE;  Surgeon: Daryll Brod, MD;  Location: [facility];  Service: Orthopedics;  Laterality: Right;  . COLONOSCOPY    . orthoscopic knee surgery    . TOTAL KNEE ARTHROPLASTY Right 10/14/2016   Procedure: RIGHT TOTAL KNEE ARTHROPLASTY;  Surgeon: Vickey Huger, MD;  Location: [place] [facility];   Service: Orthopedics;  Laterality: Right;  . TRIGGER FINGER RELEASE Right 11/06/2017   Procedure: RIGHT RELEASE TRIGGER FINGER/A-1 PULLEY;  Surgeon: Daryll Brod, MD;  Location: [facility];  Service: Orthopedics;  Laterality: Right;    Current Medications: Current Meds  Medication Sig  . albuterol (PROVENTIL HFA;VENTOLIN HFA) 108 (90 Base) MCG/ACT inhaler Inhale into the lungs every 6 (six) hours as needed for wheezing or shortness of breath.  Marland Kitchen amLODipine (NORVASC) 2.5 MG tablet Take 1 tablet by mouth daily.  . calcium carbonate 200 MG capsule Take 250 mg by mouth daily. Doesn't know dosage   . EPINEPHrine 0.3 mg/0.3 mL IJ SOAJ injection Inject 0.3 mg into the muscle once. As needed for allergic reaction  . fexofenadine (ALLEGRA ALLERGY) 180 MG tablet Take 180 mg by mouth daily.  . fluticasone (FLONASE) 50 MCG/ACT nasal spray Place 2 sprays into both nostrils daily.  Marland Kitchen glimepiride (AMARYL) 2 MG tablet Take 2 mg by mouth daily with breakfast.  . metFORMIN (GLUCOPHAGE) 500 MG tablet Take 500 mg by mouth daily.  . Montelukast Sodium (SINGULAIR PO) Take 1 tablet by mouth daily.   . Semaglutide, 1 MG/DOSE, (OZEMPIC, 1 MG/DOSE,) 2 MG/1.5ML SOPN Inject 1 mg into the skin once a week.  Marland Kitchen UNABLE TO FIND Compound  cream pt doesn't know the name. Uses for patients thumb and knees 2 times a day topically  . UNABLE TO FIND Allergy vaccines, takes one injection weekly and dosages change when she changes vial  . valsartan-hydrochlorothiazide (DIOVAN-HCT) 160-12.5 MG tablet Take 1 tablet by mouth daily.  . [DISCONTINUED] chlorthalidone (HYGROTON) 25 MG tablet Take 25 mg by mouth daily.     Allergies:   Betadine [povidone iodine]; Other; Gadolinium derivatives; Codeine; and Demerol   Social History   Socioeconomic History  . Marital status: Married    Spouse name: Not on file  . Number of children: Not on file  . Years of education: Not on file  . Highest education level: Not on file    Occupational History  . Not on file  Social Needs  . Financial resource strain: Not on file  . Food insecurity:    Worry: Not on file    Inability: Not on file  . Transportation needs:    Medical: Not on file    Non-medical: Not on file  Tobacco Use  . Smoking status: Never Smoker  . Smokeless tobacco: Never Used  Substance and Sexual Activity  . Alcohol use: No  . Drug use: No  . Sexual activity: Yes    Birth control/protection: None  Lifestyle  . Physical activity:    Days per week: Not on file    Minutes per session: Not on file  . Stress: Not on file  Relationships  . Social connections:    Talks on phone: Not on file    Gets together: Not on file    Attends religious service: Not on file    Active member of club or organization: Not on file    Attends meetings of clubs or organizations: Not on file    Relationship status: Not on file  Other Topics Concern  . Not on file  Social History Narrative  . Not on file     Family History: The patient's family history includes Cancer in her maternal grandmother; Diabetes in her maternal grandmother; Hypertension in her mother.  ROS:   Please see the history of present illness.    Musculoskeletal complaints including right knee, right thigh, and lower back.  All other systems reviewed and are negative.  EKGs/Labs/Other Studies Reviewed:    The following studies were reviewed today: No recent cardiac evaluation.  She did have total knee replacement surgery approximately 2 years ago without cardiac complications.  EKG:  EKG is  ordered today.  The ekg ordered today demonstrates 54 bpm, sinus rhythm, poor R wave progression, and when compared to prior tracings 2018 in 2017 no change in appearance or heart rate is noted.  Recent Labs: 11/04/2017: BUN 19; Creatinine, Ser 1.07; Potassium 3.7; Sodium 139  Recent Lipid Panel No results found for: CHOL, TRIG, HDL, CHOLHDL, VLDL, LDLCALC, LDLDIRECT  Physical Exam:    VS:   BP 132/76   Pulse (!) 54   Ht 5\' 1"  (1.549 m)   Wt 200 lb 9.6 oz (91 kg)   BMI 37.90 kg/m     Wt Readings from Last 3 Encounters:  09/04/18 200 lb 9.6 oz (91 kg)  11/06/17 203 lb 6 oz (92.3 kg)  10/14/16 186 lb (84.4 kg)     GEN: Moderate truncal obesity  in no acute distress HEENT: Normal NECK: No JVD. LYMPHATICS: No lymphadenopathy CARDIAC: RRR, no murmur, no gallop, no edema. VASCULAR: 2+ carotid, radial, and posterior tibial bilateral pulses.  No bruits.  RESPIRATORY:  Clear to auscultation without rales, wheezing or rhonchi  ABDOMEN: Soft, non-tender, non-distended, No pulsatile mass, MUSCULOSKELETAL: No deformity  SKIN: Warm and dry NEUROLOGIC:  Alert and oriented x 3 PSYCHIATRIC:  Normal affect   ASSESSMENT:    1. Bradycardia   2. Essential hypertension   3. Hyperlipidemia LDL goal <70   4. Status post total knee replacement, unspecified laterality   5. Controlled type 2 diabetes mellitus with diabetic nephropathy, without long-term current use of insulin (HCC)    PLAN:    In order of problems listed above:  1. She has had sinus bradycardia on EKG is now for approximately 2 years.  She is asymptomatic.  Plan a 24-hour Holter monitor to determine burden of bradycardia and to rule more severe but unexpected bradycardia or pauses.  She may be starting to develop a symptomatic sinus node dysfunction.  There is no indication for pacemaker therapy at this time. 2. Target blood pressure is 130/80 mmHg.  Her current level of diuretic therapy is perhaps too intense.  We will decrease HCTZ to 25 mg/day in the form of Diovan HCT 160/25 mg daily and discontinue chlorthalidone.  If blood pressure rises above target, we can increase amlodipine to 5 mg/day. 3. Since she is diabetic and with multiple other risk factors LDL target should be that of a person with previous myocardial infarction.  LDL target should be less than 70.  We will start rosuvastatin 10 mg/day.  A liver and lipid  panel will be done in 6 to 8 weeks. 4. Still having some difficulty with mobility and pain in the right leg following knee replacement surgery. 5. Most recent hemoglobin A1c was less than 7.  I encouraged continued efforts at moderate aerobic activity.  An exercise treadmill test will be done to screen for silent ischemia.  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  She has no symptoms of angina.  She is concerned about bradycardia.  A 24-hour Holter monitor will be done to quantitate the burden of significant bradycardia and to rule out unexpected significant pauses or other arrhythmia.  We spent considerable time discussing primary/secondary prevention of cardiovascular events: Moderate aerobic activity greater than 134minutes/week, LDL cholesterol less than 70, hemoglobin A1c less than 7, weight loss, blood pressure control at or below 130/80 mmHg.  Low-salt diet and screening for sleep apnea if needed.   Medication Adjustments/Labs and Tests Ordered: Current medicines are reviewed at length with the patient today.  Concerns regarding medicines are outlined above.  Orders Placed This Encounter  Procedures  . Holter monitor - 24 hour  . EKG 12-Lead   No orders of the defined types were placed in this encounter.   Patient Instructions  Medication Instructions:  1) DISCONTINUE Chlorthalidone 2) INCREASE Valsartan/HCT to 160/25mg  once daily  If you need a refill on your cardiac medications before your next appointment, please call your pharmacy.   Lab work: None  If you have labs (blood work) drawn today and your tests are completely normal, you will receive your results only by: Marland Kitchen MyChart Message (if you have MyChart) OR . A paper copy in the mail If you have any lab test that is abnormal or we need to change your treatment, we will call you to review the results.  Testing/Procedures: Your physician has recommended that you wear a 24 hour holter monitor. Holter monitors are  medical devices that record the heart's electrical activity. Doctors most often use these monitors to diagnose  arrhythmias. Arrhythmias are problems with the speed or rhythm of the heartbeat. The monitor is a small, portable device. You can wear one while you do your normal daily activities. This is usually used to diagnose what is causing palpitations/syncope (passing out).   Follow-Up: At [facility], you and your health needs are our priority.  As part of our continuing mission to provide you with exceptional heart care, we have created designated Provider Care Teams.  These Care Teams include your primary Cardiologist (physician) and Advanced Practice Providers (APPs -  Physician Assistants and Nurse Practitioners) who all work together to provide you with the care you need, when you need it. You will need a follow up appointment in 12 months.  Please call our office 2 months in advance to schedule this appointment.  You may see Dr. Tamala Julian or one of the following Advanced Practice Providers on your designated Care Team:   Truitt Merle, NP Cecilie Kicks, NP . Kathyrn Drown, NP  Any Other Special Instructions Will Be Listed Below (If Applicable).       Signed, Sinclair Grooms, MD  09/04/2018 3:43 PM    [place]

## 2018-09-04 ENCOUNTER — Encounter: Payer: Self-pay | Admitting: Interventional Cardiology

## 2018-09-04 ENCOUNTER — Ambulatory Visit: Payer: Medicare Other | Admitting: Interventional Cardiology

## 2018-09-04 VITALS — BP 132/76 | HR 54 | Ht 61.0 in | Wt 200.6 lb

## 2018-09-04 DIAGNOSIS — R001 Bradycardia, unspecified: Secondary | ICD-10-CM | POA: Diagnosis not present

## 2018-09-04 DIAGNOSIS — I10 Essential (primary) hypertension: Secondary | ICD-10-CM

## 2018-09-04 DIAGNOSIS — Z96659 Presence of unspecified artificial knee joint: Secondary | ICD-10-CM | POA: Diagnosis not present

## 2018-09-04 DIAGNOSIS — E785 Hyperlipidemia, unspecified: Secondary | ICD-10-CM

## 2018-09-04 DIAGNOSIS — E1121 Type 2 diabetes mellitus with diabetic nephropathy: Secondary | ICD-10-CM

## 2018-09-04 MED ORDER — ROSUVASTATIN CALCIUM 10 MG PO TABS: 10.0000 mg | ORAL_TABLET | Freq: Every day | ORAL | 3 refills | Status: DC

## 2018-09-04 MED ORDER — VALSARTAN-HYDROCHLOROTHIAZIDE 160-25 MG PO TABS: 1.0000 | ORAL_TABLET | Freq: Every day | ORAL | 3 refills | Status: DC

## 2018-09-04 NOTE — Addendum Note (Signed)
Addended by: Loren Racer on: 09/04/2018 04:17 PM   Modules accepted: Orders

## 2018-09-04 NOTE — Patient Instructions (Signed)
Medication Instructions:  1) DISCONTINUE Chlorthalidone 2) INCREASE Valsartan/HCT to 160/25mg  once daily  If you need a refill on your cardiac medications before your next appointment, please call your pharmacy.   Lab work: None  If you have labs (blood work) drawn today and your tests are completely normal, you will receive your results only by: Marland Kitchen MyChart Message (if you have MyChart) OR . A paper copy in the mail If you have any lab test that is abnormal or we need to change your treatment, we will call you to review the results.  Testing/Procedures: Your physician has recommended that you wear a 24 hour holter monitor. Holter monitors are medical devices that record the heart's electrical activity. Doctors most often use these monitors to diagnose arrhythmias. Arrhythmias are problems with the speed or rhythm of the heartbeat. The monitor is a small, portable device. You can wear one while you do your normal daily activities. This is usually used to diagnose what is causing palpitations/syncope (passing out).   Follow-Up: At [facility], you and your health needs are our priority.  As part of our continuing mission to provide you with exceptional heart care, we have created designated Provider Care Teams.  These Care Teams include your primary Cardiologist (physician) and Advanced Practice Providers (APPs -  Physician Assistants and Nurse Practitioners) who all work together to provide you with the care you need, when you need it. You will need a follow up appointment in 12 months.  Please call our office 2 months in advance to schedule this appointment.  You may see Dr. Tamala Julian or one of the following Advanced Practice Providers on your designated Care Team:   Truitt Merle, NP Cecilie Kicks, NP . Kathyrn Drown, NP  Any Other Special Instructions Will Be Listed Below (If Applicable).

## 2018-09-07 ENCOUNTER — Telehealth: Payer: Self-pay | Admitting: Interventional Cardiology

## 2018-09-07 NOTE — Telephone Encounter (Signed)
New Message          Patient called today to follow up on medications. Patient would like Anderson Malta to call only.

## 2018-09-07 NOTE — Telephone Encounter (Signed)
Spoke with pt and she would like to be switched to something besides the Valsartan/HCT due to all of the recalls over the last year with this medication.  Advised I will send message to Dr. Tamala Julian for review.

## 2018-09-09 NOTE — Telephone Encounter (Signed)
Let her know that most ARB's have had some recall issues over the past 18 months. Switch to Avalide 20/25 mg of that combination is available.  With the switch we will need to monitor the blood pressure to make sure that we have made a reasonable dose estimate.

## 2018-09-10 MED ORDER — IRBESARTAN-HYDROCHLOROTHIAZIDE 150-12.5 MG PO TABS: 1.0000 | ORAL_TABLET | Freq: Every day | ORAL | 3 refills | Status: DC

## 2018-09-10 NOTE — Telephone Encounter (Signed)
Went to send in medication and realized dose was incorrect.  Spoke with Dr. Tamala Julian and he said Avalide 150/12.5mg  QD.  Spoke with pt and went over recommendations per Dr. Tamala Julian.  Advised to monitor BP and let us know if is starts to rise on the lower dose of HCTZ.  Pt verbalized understanding and was in agreement with this plan.

## 2018-10-02 ENCOUNTER — Ambulatory Visit (INDEPENDENT_AMBULATORY_CARE_PROVIDER_SITE_OTHER): Payer: Medicare Other

## 2018-10-02 ENCOUNTER — Other Ambulatory Visit: Payer: Medicare Other | Admitting: *Deleted

## 2018-10-02 DIAGNOSIS — E1121 Type 2 diabetes mellitus with diabetic nephropathy: Secondary | ICD-10-CM | POA: Diagnosis not present

## 2018-10-02 DIAGNOSIS — R001 Bradycardia, unspecified: Secondary | ICD-10-CM

## 2018-10-02 DIAGNOSIS — I10 Essential (primary) hypertension: Secondary | ICD-10-CM | POA: Diagnosis not present

## 2018-10-02 DIAGNOSIS — E785 Hyperlipidemia, unspecified: Secondary | ICD-10-CM

## 2018-10-02 LAB — LIPID PANEL
Chol/HDL Ratio: 2 ratio (ref 0.0–4.4)
Cholesterol, Total: 89 mg/dL — ABNORMAL LOW (ref 100–199)
HDL: 44 mg/dL (ref >39)
LDL Calculated: 34 mg/dL (ref 0–99)
Triglycerides: 56 mg/dL (ref 0–149)
VLDL Cholesterol Cal: 11 mg/dL (ref 5–40)

## 2018-10-02 LAB — HEPATIC FUNCTION PANEL
ALT: 18 IU/L (ref 0–32)
AST: 18 IU/L (ref 0–40)
Albumin: 4 g/dL (ref 3.5–4.8)
Alkaline Phosphatase: 71 IU/L (ref 39–117)
Bilirubin Total: 0.2 mg/dL (ref 0.0–1.2)
Bilirubin, Direct: 0.08 mg/dL (ref 0.00–0.40)
Total Protein: 6.3 g/dL (ref 6.0–8.5)

## 2018-10-02 LAB — EXERCISE TOLERANCE TEST
Estimated workload: 7 METS
Exercise duration (min): 5 min
Exercise duration (sec): 47 s
MPHR: 148 {beats}/min
Peak HR: 120 {beats}/min
Percent HR: 81 %
RPE: 14
Rest HR: 56 {beats}/min

## 2018-10-06 ENCOUNTER — Telehealth: Payer: Self-pay | Admitting: Interventional Cardiology

## 2018-10-06 MED ORDER — ROSUVASTATIN CALCIUM 5 MG PO TABS: 5.0000 mg | ORAL_TABLET | Freq: Every day | ORAL | 3 refills | Status: DC

## 2018-10-06 NOTE — Telephone Encounter (Signed)
Spoke with pt and went over results of labs and ETT.  Pt states that she did take her Amlodipine and Avalide prior to the test.  Pt states her BP has been running high.  She will fax over a copy of her BPs that she has recorded.  Advised I will send message to Dr. Tamala Julian and have him review the BPs once received.

## 2018-10-06 NOTE — Telephone Encounter (Signed)
Follow Up   Pt returning call for nurse about lab and test results. Please call

## 2018-10-12 NOTE — Telephone Encounter (Signed)
Late Entry from 11/22:  Spoke with pt on 11/22 and she said she had not sent over the BP readings yet.  She was back in [place] at the time and states she will fax them over as soon as she returns to [place].

## 2018-10-26 NOTE — Telephone Encounter (Signed)
Increase amlodipine to 5 mg/day.  Continue to monitor blood pressure hoping to consistently get LDL less than 130 mmHg.

## 2018-10-26 NOTE — Telephone Encounter (Signed)
BP readings received.  BPs from 11/7-12/4.  They range from 112-168/60-70.  Most SBPs noted to be in the 150-160 range, most DBPs in the 60s.  HR mostly in the 50's.   Will route to Dr. Tamala Julian for review.

## 2018-10-26 NOTE — Telephone Encounter (Signed)
Left message to call back  

## 2018-10-27 MED ORDER — AMLODIPINE BESYLATE 5 MG PO TABS: 5.0000 mg | ORAL_TABLET | Freq: Every day | ORAL | 3 refills | Status: DC

## 2018-10-27 NOTE — Telephone Encounter (Signed)
Spoke with pt and went over recommendations per Dr. Tamala Julian.  Pt in agreement with plan.  New prescription sent to pharmacy.    While on the phone pt mentioned that since starting Rosuvastatin 5mg  QD she has been having aches in her lower legs.  Advised I will send to Dr. Tamala Julian to review.

## 2018-11-02 NOTE — Telephone Encounter (Signed)
Continue if tolerable, otherwise decrease to M,W, F.

## 2018-11-02 NOTE — Telephone Encounter (Signed)
Dr. Tamala Julian- please address Rosuvastatin issue in previous message.

## 2018-11-02 NOTE — Telephone Encounter (Signed)
Spoke with pt and made her aware of recommendations per Dr. Tamala Julian.  Pt agreeable to try to continue QD.  Pt appreciative for call.

## 2019-05-28 ENCOUNTER — Other Ambulatory Visit: Payer: Self-pay

## 2019-05-28 DIAGNOSIS — G5602 Carpal tunnel syndrome, left upper limb: Secondary | ICD-10-CM

## 2019-06-29 ENCOUNTER — Ambulatory Visit (INDEPENDENT_AMBULATORY_CARE_PROVIDER_SITE_OTHER): Payer: Medicare Other | Admitting: Neurology

## 2019-06-29 ENCOUNTER — Other Ambulatory Visit: Payer: Self-pay

## 2019-06-29 DIAGNOSIS — G5622 Lesion of ulnar nerve, left upper limb: Secondary | ICD-10-CM

## 2019-06-29 DIAGNOSIS — G5603 Carpal tunnel syndrome, bilateral upper limbs: Secondary | ICD-10-CM

## 2019-06-29 NOTE — Procedures (Signed)
[facility]  [place], [place]  [place], [place] [address] Tel: [phone] Fax:  [phone] Test Date:  06/29/2019  Patient: Cheryl Good DOB: [birth year] Physician: Narda Amber, DO  Sex: Female Height: 5\' 1"  Ref Phys: Daryll Brod, MD  ID#: [id number] Temp: 32.0C Technician:    Patient Complaints: This is a 73 year old female with history of right CTS release referred for evaluation of bilateral hand paresthesias, worse on the left.  NCV & EMG Findings: Extensive electrodiagnostic testing of the left upper extremity and additional studies of the right shows:  1. Left median and ulnar sensory responses show prolonged latency (L4.3, L3.3 ms).  Right mixed palmar sensory response shows prolonged latency.  Right median and ulnar sensory responses are within normal limits. 2. Left median motor response shows prolonged latency (4.3 ms).  Left ulnar motor response shows slowed conduction velocity across the elbow (A Elbow-B Elbow, 42 m/s), with normal latency and amplitude.  Right median and ulnar motor responses are within normal limits. 3. There is no evidence of active or chronic motor axonal loss changes affecting any of the tested muscles.  Motor unit configuration and recruitment pattern is within normal limits.   Impression: 1. 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   2. Left ulnar neuropathy with slowing across the elbow, purely demyelinating type, mild. 3. Right median neuropathy at or distal to the wrist (very mild), consistent with a clinical diagnosis of carpal tunnel syndrome.   ___________________________ Narda Amber, DO    Nerve Conduction Studies Anti Sensory Summary Table   Site NR Peak (ms) Norm Peak (ms) P-T Amp (V) Norm P-T Amp  Left Median Anti Sensory (2nd Digit)  Wrist    4.3 <3.8 11.0 >10  Right Median Anti Sensory (2nd Digit)  32C  Wrist    3.6  <3.8 21.3 >10  Left Ulnar Anti Sensory (5th Digit)  Wrist    3.3 <3.2 12.7 >5  Right Ulnar Anti Sensory (5th Digit)  32C  Wrist    2.9 <3.2 29.0 >5   Motor Summary Table   Site NR Onset (ms) Norm Onset (ms) O-P Amp (mV) Norm O-P Amp Site1 Site2 Delta-0 (ms) Dist (cm) Vel (m/s) Norm Vel (m/s)  Left Median Motor (Abd Poll Brev)  Wrist    4.3 <4.0 11.0 >5 Elbow Wrist 5.7 29.5 52 >50  Elbow    10.0  11.0         Right Median Motor (Abd Poll Brev)  32C  Wrist    3.4 <4.0 10.2 >5 Elbow Wrist 5.0 30.0 60 >50  Elbow    8.4  8.6         Left Ulnar Motor (Abd Dig Minimi)  Wrist    2.8 <3.1 11.8 >7 B Elbow Wrist 4.0 21.0 53 >50  B Elbow    6.8  10.4  A Elbow B Elbow 2.4 10.0 42 >50  A Elbow    9.2  10.0         Right Ulnar Motor (Abd Dig Minimi)  32C  Wrist    2.6 <3.1 14.7 >7 B Elbow Wrist 3.7 21.5 58 >50  B Elbow    6.3  13.4  A Elbow B Elbow 1.9 10.0 53 >50  A Elbow    8.2  12.7          Comparison Summary Table   Site NR Peak (ms) Norm Peak (  ms) P-T Amp (V) Site1 Site2 Delta-P (ms) Norm Delta (ms)  Right Median/Ulnar Palm Comparison (Wrist - 8cm)  32C  Median Palm    2.4 <2.2 23.1 Median Palm Ulnar Palm 0.7   Ulnar Palm    1.7 <2.2 22.5       EMG   Side Muscle Ins Act Fibs Psw Fasc Number Recrt Dur Dur. Amp Amp. Poly Poly. Comment  Left 1stDorInt Nml Nml Nml Nml Nml Nml Nml Nml Nml Nml Nml Nml N/A  Left Abd Poll Brev Nml Nml Nml Nml Nml Nml Nml Nml Nml Nml Nml Nml N/A  Left PronatorTeres Nml Nml Nml Nml Nml Nml Nml Nml Nml Nml Nml Nml N/A  Left Biceps Nml Nml Nml Nml Nml Nml Nml Nml Nml Nml Nml Nml N/A  Left Triceps Nml Nml Nml Nml Nml Nml Nml Nml Nml Nml Nml Nml N/A  Left Deltoid Nml Nml Nml Nml Nml Nml Nml Nml Nml Nml Nml Nml N/A  Right 1stDorInt Nml Nml Nml Nml Nml Nml Nml Nml Nml Nml Nml Nml N/A  Right Abd Poll Brev Nml Nml Nml Nml Nml Nml Nml Nml Nml Nml Nml Nml N/A  Right PronatorTeres Nml Nml Nml Nml Nml Nml Nml Nml Nml Nml Nml Nml N/A  Right Biceps Nml Nml Nml Nml Nml  Nml Nml Nml Nml Nml Nml Nml N/A  Right Triceps Nml Nml Nml Nml Nml Nml Nml Nml Nml Nml Nml Nml N/A  Right Deltoid Nml Nml Nml Nml Nml Nml Nml Nml Nml Nml Nml Nml N/A  Left FlexCarpiUln Nml Nml Nml Nml Nml Nml Nml Nml Nml Nml Nml Nml N/A      Waveforms:

## 2019-07-29 ENCOUNTER — Other Ambulatory Visit: Payer: Self-pay | Admitting: Interventional Cardiology

## 2019-08-05 ENCOUNTER — Telehealth: Payer: Self-pay | Admitting: *Deleted

## 2019-08-05 DIAGNOSIS — E785 Hyperlipidemia, unspecified: Secondary | ICD-10-CM

## 2019-08-05 NOTE — Telephone Encounter (Signed)
Left message for pt to call back.  Pt needs to be scheduled with Dr. Tamala Julian next available.  Also needs fasting labs (Lipid, liver).  Can do labs same day as appt or she can come in earlier.  Lab orders in Epic.

## 2019-08-05 NOTE — Telephone Encounter (Signed)
-----   Message from Loren Racer, LPN sent at [id number] 10:31 AM EST ----- Needs Lipid and liver in 10/12 months.  Last drawn early Nov 2019

## 2019-08-11 NOTE — Telephone Encounter (Signed)
Lmtcb.

## 2019-08-16 NOTE — Telephone Encounter (Signed)
Pt scheduled for labs and appt with Dr. Tamala Julian in November.

## 2019-10-11 ENCOUNTER — Other Ambulatory Visit: Payer: Medicare Other | Admitting: *Deleted

## 2019-10-11 ENCOUNTER — Encounter (INDEPENDENT_AMBULATORY_CARE_PROVIDER_SITE_OTHER): Payer: Self-pay

## 2019-10-11 ENCOUNTER — Other Ambulatory Visit: Payer: Self-pay

## 2019-10-11 DIAGNOSIS — E785 Hyperlipidemia, unspecified: Secondary | ICD-10-CM

## 2019-10-11 LAB — LIPID PANEL
Chol/HDL Ratio: 1.9 ratio (ref 0.0–4.4)
Cholesterol, Total: 111 mg/dL (ref 100–199)
HDL: 58 mg/dL (ref >39)
LDL Chol Calc (NIH): 43 mg/dL (ref 0–99)
Triglycerides: 38 mg/dL (ref 0–149)
VLDL Cholesterol Cal: 10 mg/dL (ref 5–40)

## 2019-10-11 LAB — HEPATIC FUNCTION PANEL
ALT: 7 IU/L (ref 0–32)
AST: 15 IU/L (ref 0–40)
Albumin: 4.1 g/dL (ref 3.7–4.7)
Alkaline Phosphatase: 61 IU/L (ref 39–117)
Bilirubin Total: 0.2 mg/dL (ref 0.0–1.2)
Bilirubin, Direct: 0.11 mg/dL (ref 0.00–0.40)
Total Protein: 6.2 g/dL (ref 6.0–8.5)

## 2019-10-11 NOTE — Progress Notes (Signed)
Cardiology Office Note:    Date:  10/13/2019   ID:  Cheryl Good, DOB 11-01-1946, MRN [id number]  PCP:  Cheryl Lei, MD  Cardiologist:  Cheryl Grooms, MD   Referring MD: Cheryl Lei, MD   Chief Complaint  Patient presents with  . Hypertension  . Hyperlipidemia    History of Present Illness:    Cheryl Good is a 73 y.o. female with a hx of hypertension, DM II, hyperlipidemia who is referred by Dr. Criss Good for evaluation of bradycardia.  [name] is doing well.  Dr. Criss Good has been monitoring and treating blood pressure.  Her current regimen is noted below.  She is tolerating the regimen without difficulty.  Denies orthopnea, PND, syncope, and peripheral edema.  Past Medical History:  Diagnosis Date  . Allergy   . Arthritis   . Chicken pox   . Complication of anesthesia    laryngospasms with amino esters; sensitive to anesthesia  . Diabetes mellitus   . Heart murmur   . Hypertension   . Measles   . Mumps   . Rubella   . Trichomonas   . Yeast infection     Past Surgical History:  Procedure Laterality Date  . BREAST LUMPECTOMY     left lumpectomy  . BUNIONECTOMY Left   . CARPAL TUNNEL RELEASE Right 11/06/2017   Procedure: RIGHT CARPAL TUNNEL RELEASE;  Surgeon: Cheryl Brod, MD;  Location: [facility];  Service: Orthopedics;  Laterality: Right;  . COLONOSCOPY    . orthoscopic knee surgery    . 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 TRIGGER FINGER RELEASE Right 11/06/2017   Procedure: RIGHT RELEASE TRIGGER FINGER/A-1 PULLEY;  Surgeon: Cheryl Brod, MD;  Location: [facility];  Service: Orthopedics;  Laterality: Right;    Current Medications: Current Meds  Medication Sig  . albuterol (PROVENTIL HFA;VENTOLIN HFA) 108 (90 Base) MCG/ACT inhaler Inhale into the lungs every 6 (six) hours as needed  for wheezing or shortness of breath.  Marland Good amLODipine (NORVASC) 5 MG tablet TAKE 1 TABLET BY MOUTH EVERY DAY  . calcium carbonate 200 MG capsule Take 250 mg by mouth daily. Doesn't know dosage   . celecoxib (CELEBREX) 200 MG capsule Take 200 mg by mouth daily.  . chlorthalidone (HYGROTON) 25 MG tablet Take 25 mg by mouth daily.  Marland Good ENTRESTO 24-26 MG Take 1 tablet by mouth 2 (two) times daily.  Marland Good EPINEPHrine 0.3 mg/0.3 mL IJ SOAJ injection Inject 0.3 mg into the muscle once. As needed for allergic reaction  . estradiol (ESTRACE) 0.1 MG/GM vaginal cream   . fexofenadine (ALLEGRA ALLERGY) 180 MG tablet Take 180 mg by mouth daily.  . fluticasone (FLONASE) 50 MCG/ACT nasal spray Place 2 sprays into both nostrils daily.  Marland Good glimepiride (AMARYL) 2 MG tablet Take 2 mg by mouth daily with breakfast.  . metFORMIN (GLUCOPHAGE) 500 MG tablet Take 500 mg by mouth daily.  . Montelukast Sodium (SINGULAIR PO) Take 1 tablet by mouth daily.   . rosuvastatin (CRESTOR) 5 MG tablet TAKE 1 TABLET BY MOUTH EVERY DAY  . Semaglutide, 1 MG/DOSE, (OZEMPIC, 1 MG/DOSE,) 2 MG/1.5ML SOPN Inject 1 mg into the skin once a week.  Marland Good UNABLE TO FIND Compound cream pt doesn't know the name. Uses for patients thumb and knees 2 times a day topically  . UNABLE TO FIND Allergy  vaccines, takes one injection weekly and dosages change when she changes vial     Allergies:   Betadine [povidone iodine], Other, Gadolinium derivatives, Codeine, and Demerol   Social History   Socioeconomic History  . Marital status: Married    Spouse name: Not on file  . Number of children: Not on file  . Years of education: Not on file  . Highest education level: Not on file  Occupational History  . Not on file  Social Needs  . Financial resource strain: Not on file  . Food insecurity    Worry: Not on file    Inability: Not on file  . Transportation needs    Medical: Not on file    Non-medical: Not on file  Tobacco Use  . Smoking status: Never  Smoker  . Smokeless tobacco: Never Used  Substance and Sexual Activity  . Alcohol use: No  . Drug use: No  . Sexual activity: Yes    Birth control/protection: None  Lifestyle  . Physical activity    Days per week: Not on file    Minutes per session: Not on file  . Stress: Not on file  Relationships  . Social Herbalist on phone: Not on file    Gets together: Not on file    Attends religious service: Not on file    Active member of club or organization: Not on file    Attends meetings of clubs or organizations: Not on file    Relationship status: Not on file  Other Topics Concern  . Not on file  Social History Narrative  . Not on file     Family History: The patient's family history includes Cancer in her maternal grandmother; Diabetes in her maternal grandmother; Hypertension in her mother.  ROS:   Please see the history of present illness.    Under some stress related to her job.  Has some stiffness in her knee.  All other systems reviewed and are negative.  EKGs/Labs/Other Studies Reviewed:    The following studies were reviewed today: No recent data  EKG:  EKG sinus rhythm, 64 bpm, leftward axis, and when compared to the prior tracing November 2019, the heart rate is faster.  Recent Labs: 10/11/2019: ALT 7  Recent Lipid Panel    Component Value Date/Time   CHOL 111 10/11/2019 0744   TRIG 38 10/11/2019 0744   HDL 58 10/11/2019 0744   CHOLHDL 1.9 10/11/2019 0744   LDLCALC 43 10/11/2019 0744    Physical Exam:    VS:  BP 122/64   Pulse 64   Ht 5\' 1"  (1.549 m)   Wt 190 lb (86.2 kg)   SpO2 97%   BMI 35.90 kg/m     Wt Readings from Last 3 Encounters:  10/13/19 190 lb (86.2 kg)  09/04/18 200 lb 9.6 oz (91 kg)  11/06/17 203 lb 6 oz (92.3 kg)     GEN: Moderate overweight.. No acute distress HEENT: Normal NECK: No JVD. LYMPHATICS: No lymphadenopathy CARDIAC:  RRR without murmur, gallop, or edema. VASCULAR:  Normal Pulses. No  bruits. RESPIRATORY:  Clear to auscultation without rales, wheezing or rhonchi  ABDOMEN: Soft, non-tender, non-distended, No pulsatile mass, MUSCULOSKELETAL: No deformity  SKIN: Warm and dry NEUROLOGIC:  Alert and oriented x 3 PSYCHIATRIC:  Normal affect   ASSESSMENT:    1. Essential hypertension   2. Hyperlipidemia LDL goal <70   3. Controlled type 2 diabetes mellitus with diabetic nephropathy, without long-term current  use of insulin ([facility])   4. Educated about COVID-19 virus infection    PLAN:    In order of problems listed above:  1. Excellent blood pressure control on novel antihypertensive regimen by Dr. Criss Good that is chlorthalidone 25 mg daily Entresto 24/26 mg p.o. twice daily and Norvasc 5 mg/day.  Kidney function needs to be monitored.  Potassium needs to be monitored. 2. LDL is excellent. 3. Risk factor modification and target hemoglobin A1c less than 7 is being achieved. 4. The 3W's is understood and being practiced to avoid COVID-19 infection.  She is working from home.  Overall education and awareness concerning primar risk prevention was discussed in detail: LDL less than 70, hemoglobin A1c less than 7, blood pressure target less than 130/80 mmHg, >150 minutes of moderate aerobic activity per week, avoidance of smoking, weight control (via diet and exercise), and continued surveillance/management of/for obstructive sleep apnea.  I encouraged exceeding 150 minutes of moderate activity per week consolidate risk reduction and prevention as noted.  Medication Adjustments/Labs and Tests Ordered: Current medicines are reviewed at length with the patient today.  Concerns regarding medicines are outlined above.  Orders Placed This Encounter  Procedures  . EKG 12-Lead   No orders of the defined types were placed in this encounter.   Patient Instructions  Medication Instructions:  Your physician recommends that you continue on your current medications as directed. Please  refer to the Current Medication list given to you today.  *If you need a refill on your cardiac medications before your next appointment, please call your pharmacy*  Lab Work: None If you have labs (blood work) drawn today and your tests are completely normal, you will receive your results only by: Marland Good MyChart Message (if you have MyChart) OR . A paper copy in the mail If you have any lab test that is abnormal or we need to change your treatment, we will call you to review the results.  Testing/Procedures: None  Follow-Up: At [facility], you and your health needs are our priority.  As part of our continuing mission to provide you with exceptional heart care, we have created designated Provider Care Teams.  These Care Teams include your primary Cardiologist (physician) and Advanced Practice Providers (APPs -  Physician Assistants and Nurse Practitioners) who all work together to provide you with the care you need, when you need it.  Your next appointment:   12 month(s)  The format for your next appointment:   In Person  Provider:   You may see Cheryl Grooms, MD or one of the following Advanced Practice Providers on your designated Care Team:    Truitt Merle, NP  Cecilie Kicks, NP  Kathyrn Drown, NP   Other Instructions      Signed, Cheryl Grooms, MD  10/13/2019 3:51 PM    [place]

## 2019-10-13 ENCOUNTER — Ambulatory Visit: Payer: Medicare Other | Admitting: Interventional Cardiology

## 2019-10-13 ENCOUNTER — Encounter: Payer: Self-pay | Admitting: Interventional Cardiology

## 2019-10-13 ENCOUNTER — Other Ambulatory Visit: Payer: Self-pay

## 2019-10-13 VITALS — BP 122/64 | HR 64 | Ht 61.0 in | Wt 190.0 lb

## 2019-10-13 DIAGNOSIS — Z7189 Other specified counseling: Secondary | ICD-10-CM | POA: Diagnosis not present

## 2019-10-13 DIAGNOSIS — I10 Essential (primary) hypertension: Secondary | ICD-10-CM

## 2019-10-13 DIAGNOSIS — E1121 Type 2 diabetes mellitus with diabetic nephropathy: Secondary | ICD-10-CM | POA: Diagnosis not present

## 2019-10-13 DIAGNOSIS — E785 Hyperlipidemia, unspecified: Secondary | ICD-10-CM

## 2019-10-13 NOTE — Patient Instructions (Signed)

## 2019-11-04 ENCOUNTER — Other Ambulatory Visit: Payer: Self-pay | Admitting: Interventional Cardiology

## 2019-11-22 DIAGNOSIS — M25561 Pain in right knee: Secondary | ICD-10-CM | POA: Diagnosis not present

## 2019-11-24 DIAGNOSIS — M25561 Pain in right knee: Secondary | ICD-10-CM | POA: Diagnosis not present

## 2019-12-01 DIAGNOSIS — M25561 Pain in right knee: Secondary | ICD-10-CM | POA: Diagnosis not present

## 2019-12-03 ENCOUNTER — Other Ambulatory Visit: Payer: Self-pay | Admitting: Interventional Cardiology

## 2019-12-06 DIAGNOSIS — M25512 Pain in left shoulder: Secondary | ICD-10-CM | POA: Diagnosis not present

## 2019-12-06 DIAGNOSIS — M7542 Impingement syndrome of left shoulder: Secondary | ICD-10-CM | POA: Diagnosis not present

## 2019-12-06 DIAGNOSIS — M25561 Pain in right knee: Secondary | ICD-10-CM | POA: Diagnosis not present

## 2019-12-09 DIAGNOSIS — M25561 Pain in right knee: Secondary | ICD-10-CM | POA: Diagnosis not present

## 2019-12-09 DIAGNOSIS — M25562 Pain in left knee: Secondary | ICD-10-CM | POA: Diagnosis not present

## 2019-12-15 DIAGNOSIS — J3089 Other allergic rhinitis: Secondary | ICD-10-CM | POA: Diagnosis not present

## 2019-12-15 DIAGNOSIS — J301 Allergic rhinitis due to pollen: Secondary | ICD-10-CM | POA: Diagnosis not present

## 2019-12-15 DIAGNOSIS — J3081 Allergic rhinitis due to animal (cat) (dog) hair and dander: Secondary | ICD-10-CM | POA: Diagnosis not present

## 2019-12-15 DIAGNOSIS — H1045 Other chronic allergic conjunctivitis: Secondary | ICD-10-CM | POA: Diagnosis not present

## 2019-12-20 DIAGNOSIS — M25512 Pain in left shoulder: Secondary | ICD-10-CM | POA: Diagnosis not present

## 2019-12-23 DIAGNOSIS — M25512 Pain in left shoulder: Secondary | ICD-10-CM | POA: Diagnosis not present

## 2019-12-29 ENCOUNTER — Other Ambulatory Visit (HOSPITAL_COMMUNITY)
Admission: RE | Admit: 2019-12-29 | Discharge: 2019-12-29 | Disposition: A | Discharge status: Home / Self Care | Payer: Medicare PPO | Source: Ambulatory Visit | Attending: Obstetrics & Gynecology | Admitting: Obstetrics & Gynecology

## 2019-12-29 ENCOUNTER — Other Ambulatory Visit: Payer: Self-pay

## 2019-12-29 ENCOUNTER — Ambulatory Visit (INDEPENDENT_AMBULATORY_CARE_PROVIDER_SITE_OTHER): Payer: Medicare PPO | Admitting: Obstetrics & Gynecology

## 2019-12-29 ENCOUNTER — Encounter: Payer: Self-pay | Admitting: Obstetrics & Gynecology

## 2019-12-29 VITALS — BP 140/65 | HR 54 | Resp 16 | Ht 61.0 in | Wt 188.0 lb

## 2019-12-29 DIAGNOSIS — Z01419 Encounter for gynecological examination (general) (routine) without abnormal findings: Secondary | ICD-10-CM | POA: Diagnosis not present

## 2019-12-29 DIAGNOSIS — Z1151 Encounter for screening for human papillomavirus (HPV): Secondary | ICD-10-CM | POA: Insufficient documentation

## 2019-12-29 DIAGNOSIS — Z780 Asymptomatic menopausal state: Secondary | ICD-10-CM | POA: Insufficient documentation

## 2019-12-29 NOTE — Progress Notes (Signed)
Subjective:     Cheryl Good is a 74 y.o. female here for a routine exam.  LMP 50's Current complaints: none. Pt has been a pt of Dr. Leo Grosser at [facility] and has changed docs since.  She denies any current GYN issues. 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 Goes to [employer].      Gynecologic History No LMP recorded. Patient is postmenopausal. Contraception: post menopausal status Last Pap: 2 years prev. Results were: normal Last mammogram: within the year. WNL   Obstetric History OB History  Gravida Para Term Preterm AB Living  3 3 3  0 0 3  SAB TAB Ectopic Multiple Live Births  0 0 0 0 3    # Outcome Date GA Lbr Len/2nd Weight Sex Delivery Anes PTL Lv  3 Term         LIV  2 Term         LIV  1 Term         LIV   The following portions of the patient's history were reviewed and updated as appropriate: allergies, current medications, past family history, past medical history, past social history, past surgical history and problem list.  Review of Systems Pertinent items are noted in HPI.    Objective:  BP 140/65   Pulse (!) 54   Resp 16   Ht 5\' 1"  (1.549 m)   Wt 188 lb (85.3 kg)   BMI 35.52 kg/m  General Appearance:    Alert, cooperative, no distress, appears stated age  Head:    Normocephalic, without obvious abnormality, atraumatic  Eyes:    conjunctiva/corneas clear, EOM's intact, both eyes  Ears:    Normal external ear canals, both ears  Nose:   Nares normal, septum midline, mucosa normal, no drainage    or sinus tenderness  Throat:   Lips, mucosa, and tongue normal; teeth and gums normal  Neck:   Supple, symmetrical, trachea midline, no adenopathy;    thyroid:  no enlargement/tenderness/nodules  Back:     Symmetric, no curvature, ROM normal, no CVA tenderness  Lungs:     respirations unlabored  Chest Wall:    No tenderness or deformity   Heart:    Regular rate and rhythm  Breast Exam:    No tenderness, masses, or nipple  abnormality  Abdomen:     Soft, non-tender, bowel sounds active all four quadrants,    no masses, no organomegaly  Genitalia:    Normal female without lesion, discharge or tenderness     Extremities:   Extremities normal, atraumatic, no cyanosis or edema  Pulses:   2+ and symmetric all extremities  Skin:   Skin color, texture, turgor normal, no rashes or lesions    Assessment:    Healthy female exam.   No GYN issues. UTD on all screening tests.  Reviewed current PAP screening guidelines.    Plan:   F/u PAP with hrHPV F/u in 1 year or sooner prn Need records from [facility]. Harraway-Smith, M.D., [name]

## 2019-12-31 DIAGNOSIS — M25512 Pain in left shoulder: Secondary | ICD-10-CM | POA: Diagnosis not present

## 2019-12-31 LAB — CYTOLOGY - PAP
Comment: NEGATIVE
Diagnosis: NEGATIVE
High risk HPV: NEGATIVE

## 2020-01-03 DIAGNOSIS — J301 Allergic rhinitis due to pollen: Secondary | ICD-10-CM | POA: Diagnosis not present

## 2020-01-03 DIAGNOSIS — J3089 Other allergic rhinitis: Secondary | ICD-10-CM | POA: Diagnosis not present

## 2020-01-03 DIAGNOSIS — J3081 Allergic rhinitis due to animal (cat) (dog) hair and dander: Secondary | ICD-10-CM | POA: Diagnosis not present

## 2020-01-03 DIAGNOSIS — H1045 Other chronic allergic conjunctivitis: Secondary | ICD-10-CM | POA: Diagnosis not present

## 2020-01-04 ENCOUNTER — Telehealth: Payer: Self-pay

## 2020-01-04 NOTE — Telephone Encounter (Signed)
Called pt regarding PAP results. Pt made aware that her PAP smear was normal. Understanding was voiced.  Cheryl Good l Cheryl Good, CMA

## 2020-01-04 NOTE — Telephone Encounter (Signed)
-----   Message from Lavonia Drafts, MD sent at 01/04/2020  8:52 AM EST ----- Please call pt and let her know her PAP was normal!  Thx,  Clh-S

## 2020-01-05 ENCOUNTER — Encounter: Payer: Self-pay | Admitting: Obstetrics & Gynecology

## 2020-01-21 DIAGNOSIS — M25512 Pain in left shoulder: Secondary | ICD-10-CM | POA: Diagnosis not present

## 2020-01-21 DIAGNOSIS — M7542 Impingement syndrome of left shoulder: Secondary | ICD-10-CM | POA: Diagnosis not present

## 2020-03-18 DIAGNOSIS — M130 Polyarthritis, unspecified: Secondary | ICD-10-CM | POA: Diagnosis not present

## 2020-03-18 DIAGNOSIS — N189 Chronic kidney disease, unspecified: Secondary | ICD-10-CM | POA: Diagnosis not present

## 2020-03-18 DIAGNOSIS — E782 Mixed hyperlipidemia: Secondary | ICD-10-CM | POA: Diagnosis not present

## 2020-03-18 DIAGNOSIS — I10 Essential (primary) hypertension: Secondary | ICD-10-CM | POA: Diagnosis not present

## 2020-03-18 DIAGNOSIS — E1169 Type 2 diabetes mellitus with other specified complication: Secondary | ICD-10-CM | POA: Diagnosis not present

## 2020-05-05 DIAGNOSIS — G5602 Carpal tunnel syndrome, left upper limb: Secondary | ICD-10-CM | POA: Diagnosis not present

## 2020-05-05 DIAGNOSIS — M65312 Trigger thumb, left thumb: Secondary | ICD-10-CM | POA: Diagnosis not present

## 2020-06-15 ENCOUNTER — Emergency Department (HOSPITAL_COMMUNITY): Payer: Medicare PPO

## 2020-06-15 ENCOUNTER — Emergency Department (HOSPITAL_COMMUNITY)
Admission: EM | Admit: 2020-06-15 | Discharge: 2020-06-16 | Disposition: A | Discharge status: Home / Self Care | Payer: Medicare PPO | Attending: Emergency Medicine | Admitting: Emergency Medicine

## 2020-06-15 ENCOUNTER — Other Ambulatory Visit: Payer: Self-pay

## 2020-06-15 ENCOUNTER — Encounter (HOSPITAL_COMMUNITY): Payer: Self-pay | Admitting: Emergency Medicine

## 2020-06-15 DIAGNOSIS — S32010A Wedge compression fracture of first lumbar vertebra, initial encounter for closed fracture: Secondary | ICD-10-CM | POA: Diagnosis not present

## 2020-06-15 DIAGNOSIS — R519 Headache, unspecified: Secondary | ICD-10-CM | POA: Diagnosis not present

## 2020-06-15 DIAGNOSIS — M545 Low back pain: Secondary | ICD-10-CM | POA: Diagnosis not present

## 2020-06-15 DIAGNOSIS — R0602 Shortness of breath: Secondary | ICD-10-CM | POA: Insufficient documentation

## 2020-06-15 DIAGNOSIS — S34109A Unspecified injury to unspecified level of lumbar spinal cord, initial encounter: Secondary | ICD-10-CM | POA: Diagnosis present

## 2020-06-15 DIAGNOSIS — Y939 Activity, unspecified: Secondary | ICD-10-CM | POA: Insufficient documentation

## 2020-06-15 DIAGNOSIS — M549 Dorsalgia, unspecified: Secondary | ICD-10-CM

## 2020-06-15 DIAGNOSIS — S32029A Unspecified fracture of second lumbar vertebra, initial encounter for closed fracture: Secondary | ICD-10-CM | POA: Diagnosis not present

## 2020-06-15 DIAGNOSIS — M542 Cervicalgia: Secondary | ICD-10-CM | POA: Diagnosis not present

## 2020-06-15 DIAGNOSIS — R1012 Left upper quadrant pain: Secondary | ICD-10-CM | POA: Insufficient documentation

## 2020-06-15 DIAGNOSIS — N390 Urinary tract infection, site not specified: Secondary | ICD-10-CM | POA: Diagnosis not present

## 2020-06-15 DIAGNOSIS — S199XXA Unspecified injury of neck, initial encounter: Secondary | ICD-10-CM | POA: Diagnosis not present

## 2020-06-15 DIAGNOSIS — Y999 Unspecified external cause status: Secondary | ICD-10-CM | POA: Insufficient documentation

## 2020-06-15 DIAGNOSIS — S40012A Contusion of left shoulder, initial encounter: Secondary | ICD-10-CM | POA: Diagnosis not present

## 2020-06-15 DIAGNOSIS — S299XXA Unspecified injury of thorax, initial encounter: Secondary | ICD-10-CM | POA: Diagnosis not present

## 2020-06-15 DIAGNOSIS — S80211A Abrasion, right knee, initial encounter: Secondary | ICD-10-CM | POA: Insufficient documentation

## 2020-06-15 DIAGNOSIS — S4992XA Unspecified injury of left shoulder and upper arm, initial encounter: Secondary | ICD-10-CM | POA: Diagnosis not present

## 2020-06-15 DIAGNOSIS — S32009A Unspecified fracture of unspecified lumbar vertebra, initial encounter for closed fracture: Secondary | ICD-10-CM | POA: Diagnosis not present

## 2020-06-15 DIAGNOSIS — M5489 Other dorsalgia: Secondary | ICD-10-CM | POA: Diagnosis not present

## 2020-06-15 DIAGNOSIS — Z20822 Contact with and (suspected) exposure to covid-19: Secondary | ICD-10-CM | POA: Diagnosis not present

## 2020-06-15 DIAGNOSIS — S40022A Contusion of left upper arm, initial encounter: Secondary | ICD-10-CM | POA: Insufficient documentation

## 2020-06-15 DIAGNOSIS — R079 Chest pain, unspecified: Secondary | ICD-10-CM | POA: Insufficient documentation

## 2020-06-15 DIAGNOSIS — S8991XA Unspecified injury of right lower leg, initial encounter: Secondary | ICD-10-CM | POA: Diagnosis not present

## 2020-06-15 DIAGNOSIS — Z03818 Encounter for observation for suspected exposure to other biological agents ruled out: Secondary | ICD-10-CM | POA: Diagnosis not present

## 2020-06-15 DIAGNOSIS — S80212A Abrasion, left knee, initial encounter: Secondary | ICD-10-CM | POA: Diagnosis not present

## 2020-06-15 DIAGNOSIS — Y929 Unspecified place or not applicable: Secondary | ICD-10-CM | POA: Diagnosis not present

## 2020-06-15 DIAGNOSIS — S32019A Unspecified fracture of first lumbar vertebra, initial encounter for closed fracture: Secondary | ICD-10-CM | POA: Diagnosis not present

## 2020-06-15 DIAGNOSIS — S8992XA Unspecified injury of left lower leg, initial encounter: Secondary | ICD-10-CM | POA: Diagnosis not present

## 2020-06-15 DIAGNOSIS — S3991XA Unspecified injury of abdomen, initial encounter: Secondary | ICD-10-CM | POA: Diagnosis not present

## 2020-06-15 DIAGNOSIS — S32020A Wedge compression fracture of second lumbar vertebra, initial encounter for closed fracture: Secondary | ICD-10-CM | POA: Diagnosis not present

## 2020-06-15 DIAGNOSIS — M25561 Pain in right knee: Secondary | ICD-10-CM | POA: Diagnosis not present

## 2020-06-15 DIAGNOSIS — R109 Unspecified abdominal pain: Secondary | ICD-10-CM | POA: Diagnosis not present

## 2020-06-15 DIAGNOSIS — G4489 Other headache syndrome: Secondary | ICD-10-CM | POA: Diagnosis not present

## 2020-06-15 DIAGNOSIS — G44309 Post-traumatic headache, unspecified, not intractable: Secondary | ICD-10-CM | POA: Diagnosis not present

## 2020-06-15 DIAGNOSIS — M7989 Other specified soft tissue disorders: Secondary | ICD-10-CM | POA: Diagnosis not present

## 2020-06-15 DIAGNOSIS — R0789 Other chest pain: Secondary | ICD-10-CM | POA: Diagnosis not present

## 2020-06-15 DIAGNOSIS — I10 Essential (primary) hypertension: Secondary | ICD-10-CM | POA: Diagnosis not present
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
## 2020-06-16 ENCOUNTER — Emergency Department (HOSPITAL_COMMUNITY): Payer: Medicare PPO

## 2020-06-16 DIAGNOSIS — S8992XA Unspecified injury of left lower leg, initial encounter: Secondary | ICD-10-CM | POA: Diagnosis not present

## 2020-06-16 DIAGNOSIS — S299XXA Unspecified injury of thorax, initial encounter: Secondary | ICD-10-CM | POA: Diagnosis not present

## 2020-06-16 DIAGNOSIS — S199XXA Unspecified injury of neck, initial encounter: Secondary | ICD-10-CM | POA: Diagnosis not present

## 2020-06-16 DIAGNOSIS — S8991XA Unspecified injury of right lower leg, initial encounter: Secondary | ICD-10-CM | POA: Diagnosis not present

## 2020-06-16 DIAGNOSIS — M25561 Pain in right knee: Secondary | ICD-10-CM | POA: Diagnosis not present

## 2020-06-16 DIAGNOSIS — G44309 Post-traumatic headache, unspecified, not intractable: Secondary | ICD-10-CM | POA: Diagnosis not present

## 2020-06-16 DIAGNOSIS — S3991XA Unspecified injury of abdomen, initial encounter: Secondary | ICD-10-CM | POA: Diagnosis not present

## 2020-06-16 DIAGNOSIS — R109 Unspecified abdominal pain: Secondary | ICD-10-CM | POA: Diagnosis not present

## 2020-06-16 DIAGNOSIS — M7989 Other specified soft tissue disorders: Secondary | ICD-10-CM | POA: Diagnosis not present

## 2020-06-16 DIAGNOSIS — S32029A Unspecified fracture of second lumbar vertebra, initial encounter for closed fracture: Secondary | ICD-10-CM | POA: Diagnosis not present

## 2020-06-16 DIAGNOSIS — S4992XA Unspecified injury of left shoulder and upper arm, initial encounter: Secondary | ICD-10-CM | POA: Diagnosis not present

## 2020-06-16 DIAGNOSIS — S32009A Unspecified fracture of unspecified lumbar vertebra, initial encounter for closed fracture: Secondary | ICD-10-CM | POA: Diagnosis not present

## 2020-06-16 DIAGNOSIS — S32019A Unspecified fracture of first lumbar vertebra, initial encounter for closed fracture: Secondary | ICD-10-CM | POA: Diagnosis not present

## 2020-06-16 LAB — COMPREHENSIVE METABOLIC PANEL
ALT: 18 U/L (ref 0–44)
AST: 20 U/L (ref 15–41)
Albumin: 3.5 g/dL (ref 3.5–5.0)
Alkaline Phosphatase: 61 U/L (ref 38–126)
Anion gap: 11 (ref 5–15)
BUN: 26 mg/dL — ABNORMAL HIGH (ref 8–23)
CO2: 27 mmol/L (ref 22–32)
Calcium: 9.4 mg/dL (ref 8.9–10.3)
Chloride: 102 mmol/L (ref 98–111)
Creatinine, Ser: 1.13 mg/dL — ABNORMAL HIGH (ref 0.44–1.00)
GFR calc Af Amer: 56 mL/min — ABNORMAL LOW (ref >60)
GFR calc non Af Amer: 48 mL/min — ABNORMAL LOW (ref >60)
Glucose, Bld: 145 mg/dL — ABNORMAL HIGH (ref 70–99)
Potassium: 3.5 mmol/L (ref 3.5–5.1)
Sodium: 140 mmol/L (ref 135–145)
Total Bilirubin: 0.6 mg/dL (ref 0.3–1.2)
Total Protein: 6.6 g/dL (ref 6.5–8.1)

## 2020-06-16 LAB — CBC WITH DIFFERENTIAL/PLATELET
Abs Immature Granulocytes: 0.01 10*3/uL (ref 0.00–0.07)
Basophils Absolute: 0 10*3/uL (ref 0.0–0.1)
Basophils Relative: 0 %
Eosinophils Absolute: 0.1 10*3/uL (ref 0.0–0.5)
Eosinophils Relative: 2 %
HCT: 37.1 % (ref 36.0–46.0)
Hemoglobin: 11.4 g/dL — ABNORMAL LOW (ref 12.0–15.0)
Immature Granulocytes: 0 %
Lymphocytes Relative: 20 %
Lymphs Abs: 1.3 10*3/uL (ref 0.7–4.0)
MCH: 27.8 pg (ref 26.0–34.0)
MCHC: 30.7 g/dL (ref 30.0–36.0)
MCV: 90.5 fL (ref 80.0–100.0)
Monocytes Absolute: 0.6 10*3/uL (ref 0.1–1.0)
Monocytes Relative: 10 %
Neutro Abs: 4.5 10*3/uL (ref 1.7–7.7)
Neutrophils Relative %: 68 %
Platelets: 291 10*3/uL (ref 150–400)
RBC: 4.1 MIL/uL (ref 3.87–5.11)
RDW: 13.9 % (ref 11.5–15.5)
WBC: 6.6 10*3/uL (ref 4.0–10.5)
nRBC: 0 % (ref 0.0–0.2)

## 2020-06-16 LAB — URINALYSIS, ROUTINE W REFLEX MICROSCOPIC
Bilirubin Urine: NEGATIVE
Glucose, UA: NEGATIVE mg/dL
Hgb urine dipstick: NEGATIVE
Ketones, ur: NEGATIVE mg/dL
Nitrite: NEGATIVE
Protein, ur: NEGATIVE mg/dL
Specific Gravity, Urine: 1.021 (ref 1.005–1.030)
pH: 5 (ref 5.0–8.0)

## 2020-06-16 MED ORDER — OXYCODONE-ACETAMINOPHEN 5-325 MG PO TABS: 1.0000 | ORAL_TABLET | ORAL | 0 refills | Status: AC | PRN

## 2020-06-16 MED ORDER — METHOCARBAMOL 500 MG PO TABS
500.0000 mg | ORAL_TABLET | Freq: Three times a day (TID) | ORAL | 0 refills | Status: DC | PRN
Start: 2020-06-16 — End: 2022-10-15

## 2020-06-16 MED ORDER — IBUPROFEN 400 MG PO TABS
600.0000 mg | ORAL_TABLET | Freq: Once | ORAL | Status: AC
  Administered 2020-06-16: 600 mg via ORAL
  Filled 2020-06-16: qty 1

## 2020-06-16 MED ORDER — OXYCODONE-ACETAMINOPHEN 5-325 MG PO TABS
0.5000 | ORAL_TABLET | Freq: Once | ORAL | Status: AC
  Administered 2020-06-16: 0.5 via ORAL
  Filled 2020-06-16: qty 1

## 2020-06-16 MED ORDER — CEPHALEXIN 500 MG PO CAPS
500.0000 mg | ORAL_CAPSULE | Freq: Three times a day (TID) | ORAL | 0 refills | Status: AC
Start: 2020-06-16 — End: 2020-06-23

## 2020-06-16 NOTE — ED Notes (Signed)
Patient transported to CT 

## 2020-06-16 NOTE — ED Provider Notes (Signed)
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 capsule  3 times daily     Discontinue  Reprint     06/16/20 1303           Lucrezia Starch, MD 06/16/20 [phone]

## 2020-06-16 NOTE — Discharge Instructions (Addendum)
The CT scan showed transverse process fractures of L1, L2 and L3. 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 Recommend following up with neurosurgery next week for recheck.  Additionally recommend follow up with primary regarding the lipoma, uterine fibroid and your UTI. Recommend taking the prescribed antibiotic for the UTI. Can take prescribed percocet for pain control and robaxin for muscle spasms but note this can make you drowsy shopping or driving or operating heavy machinery.

## 2020-06-21 DIAGNOSIS — S32018A Other fracture of first lumbar vertebra, initial encounter for closed fracture: Secondary | ICD-10-CM | POA: Diagnosis not present

## 2020-06-21 DIAGNOSIS — S32009A Unspecified fracture of unspecified lumbar vertebra, initial encounter for closed fracture: Secondary | ICD-10-CM | POA: Diagnosis not present

## 2020-06-21 DIAGNOSIS — S32038A Other fracture of third lumbar vertebra, initial encounter for closed fracture: Secondary | ICD-10-CM | POA: Diagnosis not present

## 2020-06-21 DIAGNOSIS — S32028A Other fracture of second lumbar vertebra, initial encounter for closed fracture: Secondary | ICD-10-CM | POA: Diagnosis not present

## 2020-06-23 DIAGNOSIS — M542 Cervicalgia: Secondary | ICD-10-CM | POA: Diagnosis not present

## 2020-07-01 DIAGNOSIS — M542 Cervicalgia: Secondary | ICD-10-CM | POA: Diagnosis not present

## 2020-07-03 DIAGNOSIS — N390 Urinary tract infection, site not specified: Secondary | ICD-10-CM | POA: Diagnosis not present

## 2020-07-17 DIAGNOSIS — M47816 Spondylosis without myelopathy or radiculopathy, lumbar region: Secondary | ICD-10-CM | POA: Diagnosis not present

## 2020-07-17 DIAGNOSIS — S32009D Unspecified fracture of unspecified lumbar vertebra, subsequent encounter for fracture with routine healing: Secondary | ICD-10-CM | POA: Diagnosis not present

## 2020-07-17 DIAGNOSIS — M4316 Spondylolisthesis, lumbar region: Secondary | ICD-10-CM | POA: Diagnosis not present

## 2020-07-27 DIAGNOSIS — M542 Cervicalgia: Secondary | ICD-10-CM | POA: Diagnosis not present

## 2020-08-18 DIAGNOSIS — S32009A Unspecified fracture of unspecified lumbar vertebra, initial encounter for closed fracture: Secondary | ICD-10-CM | POA: Diagnosis not present

## 2020-08-18 DIAGNOSIS — S32009D Unspecified fracture of unspecified lumbar vertebra, subsequent encounter for fracture with routine healing: Secondary | ICD-10-CM | POA: Diagnosis not present

## 2020-08-18 DIAGNOSIS — M25551 Pain in right hip: Secondary | ICD-10-CM | POA: Diagnosis not present

## 2020-09-01 DIAGNOSIS — I10 Essential (primary) hypertension: Secondary | ICD-10-CM | POA: Diagnosis not present

## 2020-09-01 DIAGNOSIS — N340 Urethral abscess: Secondary | ICD-10-CM | POA: Diagnosis not present

## 2020-09-01 DIAGNOSIS — M542 Cervicalgia: Secondary | ICD-10-CM | POA: Diagnosis not present

## 2020-09-01 DIAGNOSIS — N390 Urinary tract infection, site not specified: Secondary | ICD-10-CM | POA: Diagnosis not present

## 2020-09-23 ENCOUNTER — Other Ambulatory Visit: Payer: Self-pay | Admitting: Interventional Cardiology

## 2020-09-26 DIAGNOSIS — S32009D Unspecified fracture of unspecified lumbar vertebra, subsequent encounter for fracture with routine healing: Secondary | ICD-10-CM | POA: Diagnosis not present

## 2020-09-27 DIAGNOSIS — Z1231 Encounter for screening mammogram for malignant neoplasm of breast: Secondary | ICD-10-CM | POA: Diagnosis not present

## 2020-10-02 DIAGNOSIS — M5459 Other low back pain: Secondary | ICD-10-CM | POA: Diagnosis not present

## 2020-10-02 DIAGNOSIS — M25559 Pain in unspecified hip: Secondary | ICD-10-CM | POA: Diagnosis not present

## 2020-10-06 DIAGNOSIS — H04123 Dry eye syndrome of bilateral lacrimal glands: Secondary | ICD-10-CM | POA: Diagnosis not present

## 2020-10-06 DIAGNOSIS — E119 Type 2 diabetes mellitus without complications: Secondary | ICD-10-CM | POA: Diagnosis not present

## 2020-10-06 DIAGNOSIS — H40013 Open angle with borderline findings, low risk, bilateral: Secondary | ICD-10-CM | POA: Diagnosis not present

## 2020-10-06 DIAGNOSIS — H25813 Combined forms of age-related cataract, bilateral: Secondary | ICD-10-CM | POA: Diagnosis not present

## 2020-10-06 DIAGNOSIS — H40051 Ocular hypertension, right eye: Secondary | ICD-10-CM | POA: Diagnosis not present

## 2020-10-09 DIAGNOSIS — M25559 Pain in unspecified hip: Secondary | ICD-10-CM | POA: Diagnosis not present

## 2020-10-09 DIAGNOSIS — M5459 Other low back pain: Secondary | ICD-10-CM | POA: Diagnosis not present

## 2020-10-11 DIAGNOSIS — M5459 Other low back pain: Secondary | ICD-10-CM | POA: Diagnosis not present

## 2020-10-11 DIAGNOSIS — M25559 Pain in unspecified hip: Secondary | ICD-10-CM | POA: Diagnosis not present

## 2020-10-16 DIAGNOSIS — M25559 Pain in unspecified hip: Secondary | ICD-10-CM | POA: Diagnosis not present

## 2020-10-16 DIAGNOSIS — M5459 Other low back pain: Secondary | ICD-10-CM | POA: Diagnosis not present

## 2020-10-19 DIAGNOSIS — M5459 Other low back pain: Secondary | ICD-10-CM | POA: Diagnosis not present

## 2020-10-19 DIAGNOSIS — M25559 Pain in unspecified hip: Secondary | ICD-10-CM | POA: Diagnosis not present

## 2020-10-20 DIAGNOSIS — M1712 Unilateral primary osteoarthritis, left knee: Secondary | ICD-10-CM | POA: Diagnosis not present

## 2020-10-20 DIAGNOSIS — M5451 Vertebrogenic low back pain: Secondary | ICD-10-CM | POA: Diagnosis not present

## 2020-10-20 DIAGNOSIS — S8001XA Contusion of right knee, initial encounter: Secondary | ICD-10-CM | POA: Diagnosis not present

## 2020-10-23 DIAGNOSIS — M25559 Pain in unspecified hip: Secondary | ICD-10-CM | POA: Diagnosis not present

## 2020-10-23 DIAGNOSIS — M5459 Other low back pain: Secondary | ICD-10-CM | POA: Diagnosis not present

## 2020-10-27 DIAGNOSIS — M25559 Pain in unspecified hip: Secondary | ICD-10-CM | POA: Diagnosis not present

## 2020-10-27 DIAGNOSIS — M5459 Other low back pain: Secondary | ICD-10-CM | POA: Diagnosis not present

## 2020-10-30 DIAGNOSIS — M25559 Pain in unspecified hip: Secondary | ICD-10-CM | POA: Diagnosis not present

## 2020-10-30 DIAGNOSIS — M5459 Other low back pain: Secondary | ICD-10-CM | POA: Diagnosis not present

## 2020-11-03 DIAGNOSIS — M542 Cervicalgia: Secondary | ICD-10-CM | POA: Diagnosis not present

## 2020-11-03 DIAGNOSIS — E1169 Type 2 diabetes mellitus with other specified complication: Secondary | ICD-10-CM | POA: Diagnosis not present

## 2020-11-06 DIAGNOSIS — M25559 Pain in unspecified hip: Secondary | ICD-10-CM | POA: Diagnosis not present

## 2020-11-06 DIAGNOSIS — M5459 Other low back pain: Secondary | ICD-10-CM | POA: Diagnosis not present

## 2020-11-08 DIAGNOSIS — M5459 Other low back pain: Secondary | ICD-10-CM | POA: Diagnosis not present

## 2020-11-08 DIAGNOSIS — M25559 Pain in unspecified hip: Secondary | ICD-10-CM | POA: Diagnosis not present

## 2020-11-13 DIAGNOSIS — M5459 Other low back pain: Secondary | ICD-10-CM | POA: Diagnosis not present

## 2020-11-13 DIAGNOSIS — M25559 Pain in unspecified hip: Secondary | ICD-10-CM | POA: Diagnosis not present

## 2020-11-15 DIAGNOSIS — M25559 Pain in unspecified hip: Secondary | ICD-10-CM | POA: Diagnosis not present

## 2020-11-15 DIAGNOSIS — M5459 Other low back pain: Secondary | ICD-10-CM | POA: Diagnosis not present

## 2020-11-24 DIAGNOSIS — M25559 Pain in unspecified hip: Secondary | ICD-10-CM | POA: Diagnosis not present

## 2020-11-24 DIAGNOSIS — M5459 Other low back pain: Secondary | ICD-10-CM | POA: Diagnosis not present

## 2020-11-30 DIAGNOSIS — M25562 Pain in left knee: Secondary | ICD-10-CM | POA: Diagnosis not present

## 2020-11-30 DIAGNOSIS — Z96651 Presence of right artificial knee joint: Secondary | ICD-10-CM | POA: Diagnosis not present

## 2020-12-01 DIAGNOSIS — M5459 Other low back pain: Secondary | ICD-10-CM | POA: Diagnosis not present

## 2020-12-01 DIAGNOSIS — M25559 Pain in unspecified hip: Secondary | ICD-10-CM | POA: Diagnosis not present

## 2020-12-01 DIAGNOSIS — S32009D Unspecified fracture of unspecified lumbar vertebra, subsequent encounter for fracture with routine healing: Secondary | ICD-10-CM | POA: Diagnosis not present

## 2020-12-08 DIAGNOSIS — M25559 Pain in unspecified hip: Secondary | ICD-10-CM | POA: Diagnosis not present

## 2020-12-08 DIAGNOSIS — M5459 Other low back pain: Secondary | ICD-10-CM | POA: Diagnosis not present

## 2020-12-15 DIAGNOSIS — M5459 Other low back pain: Secondary | ICD-10-CM | POA: Diagnosis not present

## 2020-12-15 DIAGNOSIS — M25559 Pain in unspecified hip: Secondary | ICD-10-CM | POA: Diagnosis not present

## 2020-12-22 DIAGNOSIS — M25559 Pain in unspecified hip: Secondary | ICD-10-CM | POA: Diagnosis not present

## 2020-12-22 DIAGNOSIS — M5459 Other low back pain: Secondary | ICD-10-CM | POA: Diagnosis not present

## 2020-12-29 DIAGNOSIS — M5459 Other low back pain: Secondary | ICD-10-CM | POA: Diagnosis not present

## 2020-12-29 DIAGNOSIS — M25559 Pain in unspecified hip: Secondary | ICD-10-CM | POA: Diagnosis not present

## 2021-01-12 ENCOUNTER — Other Ambulatory Visit: Payer: Self-pay

## 2021-01-12 ENCOUNTER — Encounter: Payer: Self-pay | Admitting: Interventional Cardiology

## 2021-01-12 ENCOUNTER — Ambulatory Visit: Payer: Medicare PPO | Admitting: Interventional Cardiology

## 2021-01-12 VITALS — BP 148/72 | HR 65 | Ht 61.0 in | Wt 190.2 lb

## 2021-01-12 DIAGNOSIS — M25559 Pain in unspecified hip: Secondary | ICD-10-CM | POA: Diagnosis not present

## 2021-01-12 DIAGNOSIS — I10 Essential (primary) hypertension: Secondary | ICD-10-CM

## 2021-01-12 DIAGNOSIS — E1121 Type 2 diabetes mellitus with diabetic nephropathy: Secondary | ICD-10-CM | POA: Diagnosis not present

## 2021-01-12 DIAGNOSIS — M5459 Other low back pain: Secondary | ICD-10-CM | POA: Diagnosis not present

## 2021-01-12 DIAGNOSIS — E785 Hyperlipidemia, unspecified: Secondary | ICD-10-CM | POA: Diagnosis not present

## 2021-01-12 DIAGNOSIS — Z7189 Other specified counseling: Secondary | ICD-10-CM
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
## 2021-01-12 NOTE — Patient Instructions (Signed)
Medication Instructions:  Your physician recommends that you continue on your current medications as directed. Please refer to the Current Medication list given to you today. *If you need a refill on your cardiac medications before your next appointment, please call your pharmacy*   Lab Work: none If you have labs (blood work) drawn today and your tests are completely normal, you will receive your results only by: . MyChart Message (if you have MyChart) OR . A paper copy in the mail If you have any lab test that is abnormal or we need to change your treatment, we will call you to review the results.   Testing/Procedures: none   Follow-Up: At [facility], you and your health needs are our priority.  As part of our continuing mission to provide you with exceptional heart care, we have created designated Provider Care Teams.  These Care Teams include your primary Cardiologist (physician) and Advanced Practice Providers (APPs -  Physician Assistants and Nurse Practitioners) who all work together to provide you with the care you need, when you need it.  We recommend signing up for the patient portal called "MyChart".  Sign up information is provided on this After Visit Summary.  MyChart is used to connect with patients for Virtual Visits (Telemedicine).  Patients are able to view lab/test results, encounter notes, upcoming appointments, etc.  Non-urgent messages can be sent to your provider as well.   To learn more about what you can do with MyChart, go to [email or web address].    Your next appointment:   12 month(s)  The format for your next appointment:   In Person  Provider:   You may see Henry W Smith III, MD or one of the following Advanced Practice Providers on your designated Care Team:    Jill McDaniel, NP       

## 2021-01-19 DIAGNOSIS — M25559 Pain in unspecified hip: Secondary | ICD-10-CM | POA: Diagnosis not present

## 2021-01-19 DIAGNOSIS — M5459 Other low back pain: Secondary | ICD-10-CM | POA: Diagnosis not present

## 2021-01-25 DIAGNOSIS — M5459 Other low back pain: Secondary | ICD-10-CM | POA: Diagnosis not present

## 2021-01-25 DIAGNOSIS — M25559 Pain in unspecified hip: Secondary | ICD-10-CM | POA: Diagnosis not present

## 2021-02-02 DIAGNOSIS — M25559 Pain in unspecified hip: Secondary | ICD-10-CM | POA: Diagnosis not present

## 2021-02-02 DIAGNOSIS — M5459 Other low back pain: Secondary | ICD-10-CM | POA: Diagnosis not present

## 2021-02-09 DIAGNOSIS — M25559 Pain in unspecified hip: Secondary | ICD-10-CM | POA: Diagnosis not present

## 2021-02-09 DIAGNOSIS — M5459 Other low back pain: Secondary | ICD-10-CM | POA: Diagnosis not present

## 2021-02-16 DIAGNOSIS — M5459 Other low back pain: Secondary | ICD-10-CM | POA: Diagnosis not present

## 2021-02-16 DIAGNOSIS — M25559 Pain in unspecified hip: Secondary | ICD-10-CM | POA: Diagnosis not present

## 2021-02-17 ENCOUNTER — Other Ambulatory Visit: Payer: Self-pay | Admitting: Interventional Cardiology

## 2021-02-23 DIAGNOSIS — M25559 Pain in unspecified hip: Secondary | ICD-10-CM | POA: Diagnosis not present

## 2021-02-23 DIAGNOSIS — M5459 Other low back pain: Secondary | ICD-10-CM | POA: Diagnosis not present

## 2021-03-01 DIAGNOSIS — M5459 Other low back pain: Secondary | ICD-10-CM | POA: Diagnosis not present

## 2021-03-01 DIAGNOSIS — M25559 Pain in unspecified hip: Secondary | ICD-10-CM | POA: Diagnosis not present

## 2021-03-09 DIAGNOSIS — M5459 Other low back pain: Secondary | ICD-10-CM | POA: Diagnosis not present

## 2021-03-09 DIAGNOSIS — M25559 Pain in unspecified hip: Secondary | ICD-10-CM | POA: Diagnosis not present

## 2021-03-16 DIAGNOSIS — M5459 Other low back pain: Secondary | ICD-10-CM | POA: Diagnosis not present

## 2021-03-16 DIAGNOSIS — M25559 Pain in unspecified hip: Secondary | ICD-10-CM | POA: Diagnosis not present

## 2021-03-23 DIAGNOSIS — M5459 Other low back pain: Secondary | ICD-10-CM | POA: Diagnosis not present

## 2021-03-23 DIAGNOSIS — M25559 Pain in unspecified hip: Secondary | ICD-10-CM | POA: Diagnosis not present

## 2021-03-28 DIAGNOSIS — M5459 Other low back pain: Secondary | ICD-10-CM | POA: Diagnosis not present

## 2021-03-28 DIAGNOSIS — M25559 Pain in unspecified hip: Secondary | ICD-10-CM | POA: Diagnosis not present

## 2021-03-30 DIAGNOSIS — I10 Essential (primary) hypertension: Secondary | ICD-10-CM | POA: Diagnosis not present

## 2021-03-30 DIAGNOSIS — N189 Chronic kidney disease, unspecified: Secondary | ICD-10-CM | POA: Diagnosis not present

## 2021-03-30 DIAGNOSIS — E1122 Type 2 diabetes mellitus with diabetic chronic kidney disease: Secondary | ICD-10-CM | POA: Diagnosis not present

## 2021-03-30 DIAGNOSIS — E1169 Type 2 diabetes mellitus with other specified complication: Secondary | ICD-10-CM | POA: Diagnosis not present

## 2021-03-30 DIAGNOSIS — E559 Vitamin D deficiency, unspecified: Secondary | ICD-10-CM | POA: Diagnosis not present

## 2021-03-30 DIAGNOSIS — E782 Mixed hyperlipidemia: Secondary | ICD-10-CM | POA: Diagnosis not present

## 2021-03-30 DIAGNOSIS — I129 Hypertensive chronic kidney disease with stage 1 through stage 4 chronic kidney disease, or unspecified chronic kidney disease: Secondary | ICD-10-CM | POA: Diagnosis not present

## 2021-03-30 DIAGNOSIS — M130 Polyarthritis, unspecified: Secondary | ICD-10-CM | POA: Diagnosis not present

## 2021-04-10 DIAGNOSIS — E119 Type 2 diabetes mellitus without complications: Secondary | ICD-10-CM | POA: Diagnosis not present

## 2021-04-10 DIAGNOSIS — H25813 Combined forms of age-related cataract, bilateral: Secondary | ICD-10-CM | POA: Diagnosis not present

## 2021-04-10 DIAGNOSIS — H40013 Open angle with borderline findings, low risk, bilateral: Secondary | ICD-10-CM | POA: Diagnosis not present

## 2021-04-10 DIAGNOSIS — H04123 Dry eye syndrome of bilateral lacrimal glands: Secondary | ICD-10-CM | POA: Diagnosis not present

## 2021-04-12 DIAGNOSIS — M25559 Pain in unspecified hip: Secondary | ICD-10-CM | POA: Diagnosis not present

## 2021-04-12 DIAGNOSIS — M5459 Other low back pain: Secondary | ICD-10-CM | POA: Diagnosis not present

## 2021-04-13 DIAGNOSIS — M542 Cervicalgia: Secondary | ICD-10-CM | POA: Diagnosis not present

## 2021-04-13 DIAGNOSIS — M7542 Impingement syndrome of left shoulder: Secondary | ICD-10-CM | POA: Diagnosis not present

## 2021-04-18 DIAGNOSIS — R053 Chronic cough: Secondary | ICD-10-CM | POA: Diagnosis not present

## 2021-04-18 DIAGNOSIS — J3081 Allergic rhinitis due to animal (cat) (dog) hair and dander: Secondary | ICD-10-CM | POA: Diagnosis not present

## 2021-04-18 DIAGNOSIS — R0600 Dyspnea, unspecified: Secondary | ICD-10-CM | POA: Diagnosis not present

## 2021-04-18 DIAGNOSIS — J3089 Other allergic rhinitis: Secondary | ICD-10-CM | POA: Diagnosis not present

## 2021-04-18 DIAGNOSIS — J4520 Mild intermittent asthma, uncomplicated: Secondary | ICD-10-CM | POA: Diagnosis not present

## 2021-04-18 DIAGNOSIS — H1045 Other chronic allergic conjunctivitis: Secondary | ICD-10-CM | POA: Diagnosis not present

## 2021-04-20 DIAGNOSIS — M25559 Pain in unspecified hip: Secondary | ICD-10-CM | POA: Diagnosis not present

## 2021-04-20 DIAGNOSIS — M5459 Other low back pain: Secondary | ICD-10-CM | POA: Diagnosis not present

## 2021-04-27 DIAGNOSIS — M25559 Pain in unspecified hip: Secondary | ICD-10-CM | POA: Diagnosis not present

## 2021-04-27 DIAGNOSIS — M5459 Other low back pain: Secondary | ICD-10-CM | POA: Diagnosis not present

## 2021-05-04 DIAGNOSIS — M25559 Pain in unspecified hip: Secondary | ICD-10-CM | POA: Diagnosis not present

## 2021-05-04 DIAGNOSIS — M5459 Other low back pain: Secondary | ICD-10-CM | POA: Diagnosis not present

## 2021-05-16 DIAGNOSIS — M25559 Pain in unspecified hip: Secondary | ICD-10-CM | POA: Diagnosis not present

## 2021-05-16 DIAGNOSIS — M5459 Other low back pain: Secondary | ICD-10-CM | POA: Diagnosis not present

## 2021-05-25 DIAGNOSIS — M25559 Pain in unspecified hip: Secondary | ICD-10-CM | POA: Diagnosis not present

## 2021-05-25 DIAGNOSIS — E1165 Type 2 diabetes mellitus with hyperglycemia: Secondary | ICD-10-CM | POA: Diagnosis not present

## 2021-05-25 DIAGNOSIS — M5459 Other low back pain: Secondary | ICD-10-CM | POA: Diagnosis not present

## 2021-06-02 DIAGNOSIS — Z03818 Encounter for observation for suspected exposure to other biological agents ruled out: Secondary | ICD-10-CM | POA: Diagnosis not present

## 2021-06-08 DIAGNOSIS — M25559 Pain in unspecified hip: Secondary | ICD-10-CM | POA: Diagnosis not present

## 2021-06-08 DIAGNOSIS — M5459 Other low back pain: Secondary | ICD-10-CM | POA: Diagnosis not present

## 2021-06-14 DIAGNOSIS — M5459 Other low back pain: Secondary | ICD-10-CM | POA: Diagnosis not present

## 2021-06-14 DIAGNOSIS — M25559 Pain in unspecified hip: Secondary | ICD-10-CM | POA: Diagnosis not present

## 2021-06-15 DIAGNOSIS — M542 Cervicalgia: Secondary | ICD-10-CM | POA: Diagnosis not present

## 2021-06-15 DIAGNOSIS — M25512 Pain in left shoulder: Secondary | ICD-10-CM | POA: Diagnosis not present

## 2021-06-15 DIAGNOSIS — M7542 Impingement syndrome of left shoulder: Secondary | ICD-10-CM | POA: Diagnosis not present

## 2021-06-21 DIAGNOSIS — M25559 Pain in unspecified hip: Secondary | ICD-10-CM | POA: Diagnosis not present

## 2021-06-21 DIAGNOSIS — M5459 Other low back pain: Secondary | ICD-10-CM | POA: Diagnosis not present

## 2021-06-29 DIAGNOSIS — M5459 Other low back pain: Secondary | ICD-10-CM | POA: Diagnosis not present

## 2021-06-29 DIAGNOSIS — M25559 Pain in unspecified hip: Secondary | ICD-10-CM | POA: Diagnosis not present

## 2021-07-13 DIAGNOSIS — M25559 Pain in unspecified hip: Secondary | ICD-10-CM | POA: Diagnosis not present

## 2021-07-13 DIAGNOSIS — M5459 Other low back pain: Secondary | ICD-10-CM | POA: Diagnosis not present

## 2021-07-18 DIAGNOSIS — M5459 Other low back pain: Secondary | ICD-10-CM | POA: Diagnosis not present

## 2021-07-18 DIAGNOSIS — M25559 Pain in unspecified hip: Secondary | ICD-10-CM | POA: Diagnosis not present

## 2021-07-27 DIAGNOSIS — M25559 Pain in unspecified hip: Secondary | ICD-10-CM | POA: Diagnosis not present

## 2021-07-27 DIAGNOSIS — M5459 Other low back pain: Secondary | ICD-10-CM | POA: Diagnosis not present

## 2021-08-03 DIAGNOSIS — Z6837 Body mass index (BMI) 37.0-37.9, adult: Secondary | ICD-10-CM | POA: Diagnosis not present

## 2021-08-03 DIAGNOSIS — M25559 Pain in unspecified hip: Secondary | ICD-10-CM | POA: Diagnosis not present

## 2021-08-03 DIAGNOSIS — E7800 Pure hypercholesterolemia, unspecified: Secondary | ICD-10-CM | POA: Diagnosis not present

## 2021-08-03 DIAGNOSIS — M5459 Other low back pain: Secondary | ICD-10-CM | POA: Diagnosis not present

## 2021-08-03 DIAGNOSIS — E1169 Type 2 diabetes mellitus with other specified complication: Secondary | ICD-10-CM | POA: Diagnosis not present

## 2021-08-03 DIAGNOSIS — I10 Essential (primary) hypertension: Secondary | ICD-10-CM | POA: Diagnosis not present

## 2021-08-09 DIAGNOSIS — M25559 Pain in unspecified hip: Secondary | ICD-10-CM | POA: Diagnosis not present

## 2021-08-09 DIAGNOSIS — M5459 Other low back pain: Secondary | ICD-10-CM | POA: Diagnosis not present

## 2021-08-20 DIAGNOSIS — M25559 Pain in unspecified hip: Secondary | ICD-10-CM | POA: Diagnosis not present

## 2021-08-20 DIAGNOSIS — M5459 Other low back pain: Secondary | ICD-10-CM | POA: Diagnosis not present

## 2021-08-24 DIAGNOSIS — M25562 Pain in left knee: Secondary | ICD-10-CM | POA: Diagnosis not present

## 2021-08-24 DIAGNOSIS — Z96651 Presence of right artificial knee joint: Secondary | ICD-10-CM | POA: Diagnosis not present

## 2021-08-27 ENCOUNTER — Ambulatory Visit: Payer: Medicare PPO | Admitting: Obstetrics & Gynecology

## 2021-08-31 DIAGNOSIS — M25559 Pain in unspecified hip: Secondary | ICD-10-CM | POA: Diagnosis not present

## 2021-08-31 DIAGNOSIS — M5459 Other low back pain: Secondary | ICD-10-CM | POA: Diagnosis not present

## 2021-09-06 DIAGNOSIS — E1165 Type 2 diabetes mellitus with hyperglycemia: Secondary | ICD-10-CM | POA: Diagnosis not present

## 2021-09-07 DIAGNOSIS — M25559 Pain in unspecified hip: Secondary | ICD-10-CM | POA: Diagnosis not present

## 2021-09-07 DIAGNOSIS — M5459 Other low back pain: Secondary | ICD-10-CM | POA: Diagnosis not present

## 2021-09-17 DIAGNOSIS — G5602 Carpal tunnel syndrome, left upper limb: Secondary | ICD-10-CM | POA: Diagnosis not present

## 2021-09-17 DIAGNOSIS — M180 Bilateral primary osteoarthritis of first carpometacarpal joints: Secondary | ICD-10-CM | POA: Diagnosis not present

## 2021-09-17 DIAGNOSIS — M65312 Trigger thumb, left thumb: Secondary | ICD-10-CM | POA: Diagnosis not present

## 2021-09-18 DIAGNOSIS — R52 Pain, unspecified: Secondary | ICD-10-CM | POA: Diagnosis not present

## 2021-09-18 DIAGNOSIS — M180 Bilateral primary osteoarthritis of first carpometacarpal joints: Secondary | ICD-10-CM | POA: Diagnosis not present

## 2021-09-21 DIAGNOSIS — M5459 Other low back pain: Secondary | ICD-10-CM | POA: Diagnosis not present

## 2021-09-21 DIAGNOSIS — M180 Bilateral primary osteoarthritis of first carpometacarpal joints: Secondary | ICD-10-CM | POA: Diagnosis not present

## 2021-09-21 DIAGNOSIS — M25559 Pain in unspecified hip: Secondary | ICD-10-CM | POA: Diagnosis not present

## 2021-09-28 DIAGNOSIS — Z1231 Encounter for screening mammogram for malignant neoplasm of breast: Secondary | ICD-10-CM | POA: Diagnosis not present

## 2021-09-28 DIAGNOSIS — M85852 Other specified disorders of bone density and structure, left thigh: Secondary | ICD-10-CM | POA: Diagnosis not present

## 2021-09-28 DIAGNOSIS — M85851 Other specified disorders of bone density and structure, right thigh: Secondary | ICD-10-CM | POA: Diagnosis not present

## 2021-09-28 DIAGNOSIS — Z780 Asymptomatic menopausal state: Secondary | ICD-10-CM | POA: Diagnosis not present

## 2021-10-01 ENCOUNTER — Encounter: Payer: Self-pay | Admitting: General Practice

## 2021-10-03 ENCOUNTER — Encounter: Payer: Self-pay | Admitting: General Practice

## 2021-10-05 DIAGNOSIS — M5459 Other low back pain: Secondary | ICD-10-CM | POA: Diagnosis not present

## 2021-10-05 DIAGNOSIS — M25559 Pain in unspecified hip: Secondary | ICD-10-CM | POA: Diagnosis not present

## 2021-10-09 DIAGNOSIS — M5459 Other low back pain: Secondary | ICD-10-CM | POA: Diagnosis not present

## 2021-10-09 DIAGNOSIS — M25559 Pain in unspecified hip: Secondary | ICD-10-CM | POA: Diagnosis not present

## 2021-10-15 ENCOUNTER — Ambulatory Visit (INDEPENDENT_AMBULATORY_CARE_PROVIDER_SITE_OTHER): Payer: Medicare PPO | Admitting: Obstetrics & Gynecology

## 2021-10-15 ENCOUNTER — Other Ambulatory Visit: Payer: Self-pay

## 2021-10-15 ENCOUNTER — Encounter: Payer: Self-pay | Admitting: Obstetrics & Gynecology

## 2021-10-15 VITALS — BP 148/56 | HR 88 | Ht 61.0 in | Wt 200.0 lb

## 2021-10-15 DIAGNOSIS — Z01419 Encounter for gynecological examination (general) (routine) without abnormal findings: Secondary | ICD-10-CM

## 2021-10-15 DIAGNOSIS — N952 Postmenopausal atrophic vaginitis: Secondary | ICD-10-CM

## 2021-10-15 MED ORDER — ESTRADIOL 0.1 MG/GM VA CREA: TOPICAL_CREAM | VAGINAL | 12 refills | Status: DC

## 2021-10-15 NOTE — Progress Notes (Signed)
Patient states she had bone density and mammogram last week at [facility]. Kathrene Alu RN
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
## 2021-10-18 ENCOUNTER — Encounter: Payer: Self-pay | Admitting: General Practice

## 2021-10-19 DIAGNOSIS — M5459 Other low back pain: Secondary | ICD-10-CM | POA: Diagnosis not present

## 2021-10-19 DIAGNOSIS — M25559 Pain in unspecified hip: Secondary | ICD-10-CM | POA: Diagnosis not present

## 2021-10-26 DIAGNOSIS — H25813 Combined forms of age-related cataract, bilateral: Secondary | ICD-10-CM | POA: Diagnosis not present

## 2021-10-26 DIAGNOSIS — H04123 Dry eye syndrome of bilateral lacrimal glands: Secondary | ICD-10-CM | POA: Diagnosis not present

## 2021-10-26 DIAGNOSIS — M25559 Pain in unspecified hip: Secondary | ICD-10-CM | POA: Diagnosis not present

## 2021-10-26 DIAGNOSIS — E119 Type 2 diabetes mellitus without complications: Secondary | ICD-10-CM | POA: Diagnosis not present

## 2021-10-26 DIAGNOSIS — H40013 Open angle with borderline findings, low risk, bilateral: Secondary | ICD-10-CM | POA: Diagnosis not present

## 2021-10-26 DIAGNOSIS — M5459 Other low back pain: Secondary | ICD-10-CM | POA: Diagnosis not present

## 2021-10-29 DIAGNOSIS — H25813 Combined forms of age-related cataract, bilateral: Secondary | ICD-10-CM | POA: Diagnosis not present

## 2021-10-29 DIAGNOSIS — E119 Type 2 diabetes mellitus without complications: Secondary | ICD-10-CM | POA: Diagnosis not present

## 2021-10-29 DIAGNOSIS — M180 Bilateral primary osteoarthritis of first carpometacarpal joints: Secondary | ICD-10-CM | POA: Diagnosis not present

## 2021-10-29 DIAGNOSIS — H04123 Dry eye syndrome of bilateral lacrimal glands: Secondary | ICD-10-CM | POA: Diagnosis not present

## 2021-11-02 DIAGNOSIS — M25559 Pain in unspecified hip: Secondary | ICD-10-CM | POA: Diagnosis not present

## 2021-11-02 DIAGNOSIS — M5459 Other low back pain: Secondary | ICD-10-CM | POA: Diagnosis not present

## 2021-11-07 DIAGNOSIS — M25559 Pain in unspecified hip: Secondary | ICD-10-CM | POA: Diagnosis not present

## 2021-11-07 DIAGNOSIS — M5459 Other low back pain: Secondary | ICD-10-CM | POA: Diagnosis not present

## 2021-11-15 DIAGNOSIS — M25559 Pain in unspecified hip: Secondary | ICD-10-CM | POA: Diagnosis not present

## 2021-11-15 DIAGNOSIS — M5459 Other low back pain: Secondary | ICD-10-CM | POA: Diagnosis not present

## 2021-11-23 DIAGNOSIS — J018 Other acute sinusitis: Secondary | ICD-10-CM | POA: Diagnosis not present

## 2021-11-23 DIAGNOSIS — H6502 Acute serous otitis media, left ear: Secondary | ICD-10-CM | POA: Diagnosis not present

## 2021-11-30 DIAGNOSIS — E1165 Type 2 diabetes mellitus with hyperglycemia: Secondary | ICD-10-CM | POA: Diagnosis not present

## 2021-11-30 DIAGNOSIS — M25559 Pain in unspecified hip: Secondary | ICD-10-CM | POA: Diagnosis not present

## 2021-11-30 DIAGNOSIS — E7800 Pure hypercholesterolemia, unspecified: Secondary | ICD-10-CM | POA: Diagnosis not present

## 2021-11-30 DIAGNOSIS — I10 Essential (primary) hypertension: Secondary | ICD-10-CM | POA: Diagnosis not present

## 2021-11-30 DIAGNOSIS — M5459 Other low back pain: Secondary | ICD-10-CM | POA: Diagnosis not present

## 2021-11-30 DIAGNOSIS — E1169 Type 2 diabetes mellitus with other specified complication: Secondary | ICD-10-CM | POA: Diagnosis not present

## 2021-12-06 DIAGNOSIS — J3081 Allergic rhinitis due to animal (cat) (dog) hair and dander: Secondary | ICD-10-CM | POA: Diagnosis not present

## 2021-12-06 DIAGNOSIS — R0600 Dyspnea, unspecified: Secondary | ICD-10-CM | POA: Diagnosis not present

## 2021-12-06 DIAGNOSIS — J302 Other seasonal allergic rhinitis: Secondary | ICD-10-CM | POA: Diagnosis not present

## 2021-12-06 DIAGNOSIS — J4540 Moderate persistent asthma, uncomplicated: Secondary | ICD-10-CM | POA: Diagnosis not present

## 2021-12-06 DIAGNOSIS — H1045 Other chronic allergic conjunctivitis: Secondary | ICD-10-CM | POA: Diagnosis not present

## 2021-12-06 DIAGNOSIS — J3089 Other allergic rhinitis: Secondary | ICD-10-CM | POA: Diagnosis not present

## 2021-12-06 DIAGNOSIS — R059 Cough, unspecified: Secondary | ICD-10-CM | POA: Diagnosis not present

## 2021-12-06 DIAGNOSIS — J301 Allergic rhinitis due to pollen: Secondary | ICD-10-CM | POA: Diagnosis not present

## 2021-12-20 DIAGNOSIS — H25811 Combined forms of age-related cataract, right eye: Secondary | ICD-10-CM | POA: Diagnosis not present

## 2021-12-20 DIAGNOSIS — H25813 Combined forms of age-related cataract, bilateral: Secondary | ICD-10-CM | POA: Diagnosis not present

## 2021-12-26 DIAGNOSIS — J302 Other seasonal allergic rhinitis: Secondary | ICD-10-CM | POA: Diagnosis not present

## 2021-12-26 DIAGNOSIS — R059 Cough, unspecified: Secondary | ICD-10-CM | POA: Diagnosis not present

## 2021-12-26 DIAGNOSIS — H1045 Other chronic allergic conjunctivitis: Secondary | ICD-10-CM | POA: Diagnosis not present

## 2021-12-26 DIAGNOSIS — R0600 Dyspnea, unspecified: Secondary | ICD-10-CM | POA: Diagnosis not present

## 2021-12-26 DIAGNOSIS — J342 Deviated nasal septum: Secondary | ICD-10-CM | POA: Diagnosis not present

## 2021-12-26 DIAGNOSIS — J201 Acute bronchitis due to Hemophilus influenzae: Secondary | ICD-10-CM | POA: Diagnosis not present

## 2021-12-26 DIAGNOSIS — J343 Hypertrophy of nasal turbinates: Secondary | ICD-10-CM | POA: Diagnosis not present

## 2021-12-26 DIAGNOSIS — J4530 Mild persistent asthma, uncomplicated: Secondary | ICD-10-CM | POA: Diagnosis not present

## 2022-01-04 DIAGNOSIS — M5459 Other low back pain: Secondary | ICD-10-CM | POA: Diagnosis not present

## 2022-01-04 DIAGNOSIS — M654 Radial styloid tenosynovitis [de Quervain]: Secondary | ICD-10-CM | POA: Diagnosis not present

## 2022-01-04 DIAGNOSIS — M180 Bilateral primary osteoarthritis of first carpometacarpal joints: Secondary | ICD-10-CM | POA: Diagnosis not present

## 2022-01-04 DIAGNOSIS — M25559 Pain in unspecified hip: Secondary | ICD-10-CM | POA: Diagnosis not present

## 2022-01-23 DIAGNOSIS — J201 Acute bronchitis due to Hemophilus influenzae: Secondary | ICD-10-CM | POA: Diagnosis not present

## 2022-01-23 DIAGNOSIS — J301 Allergic rhinitis due to pollen: Secondary | ICD-10-CM | POA: Diagnosis not present

## 2022-01-23 DIAGNOSIS — J4540 Moderate persistent asthma, uncomplicated: Secondary | ICD-10-CM | POA: Diagnosis not present

## 2022-01-23 DIAGNOSIS — J302 Other seasonal allergic rhinitis: Secondary | ICD-10-CM | POA: Diagnosis not present

## 2022-01-23 DIAGNOSIS — J3089 Other allergic rhinitis: Secondary | ICD-10-CM | POA: Diagnosis not present

## 2022-01-23 DIAGNOSIS — J3081 Allergic rhinitis due to animal (cat) (dog) hair and dander: Secondary | ICD-10-CM | POA: Diagnosis not present

## 2022-01-23 DIAGNOSIS — H1045 Other chronic allergic conjunctivitis: Secondary | ICD-10-CM | POA: Diagnosis not present

## 2022-02-22 DIAGNOSIS — M25559 Pain in unspecified hip: Secondary | ICD-10-CM | POA: Diagnosis not present

## 2022-02-22 DIAGNOSIS — M5459 Other low back pain: Secondary | ICD-10-CM | POA: Diagnosis not present

## 2022-02-26 DIAGNOSIS — H25811 Combined forms of age-related cataract, right eye: Secondary | ICD-10-CM | POA: Diagnosis not present

## 2022-03-01 DIAGNOSIS — M180 Bilateral primary osteoarthritis of first carpometacarpal joints: Secondary | ICD-10-CM | POA: Diagnosis not present

## 2022-03-01 DIAGNOSIS — M654 Radial styloid tenosynovitis [de Quervain]: Secondary | ICD-10-CM | POA: Diagnosis not present

## 2022-03-08 DIAGNOSIS — H25812 Combined forms of age-related cataract, left eye: Secondary | ICD-10-CM | POA: Diagnosis not present

## 2022-03-09 ENCOUNTER — Other Ambulatory Visit: Payer: Self-pay | Admitting: Interventional Cardiology

## 2022-03-18 DIAGNOSIS — R059 Cough, unspecified: Secondary | ICD-10-CM | POA: Diagnosis not present

## 2022-03-18 DIAGNOSIS — J3089 Other allergic rhinitis: Secondary | ICD-10-CM | POA: Diagnosis not present

## 2022-03-18 DIAGNOSIS — J3081 Allergic rhinitis due to animal (cat) (dog) hair and dander: Secondary | ICD-10-CM | POA: Diagnosis not present

## 2022-03-18 DIAGNOSIS — J301 Allergic rhinitis due to pollen: Secondary | ICD-10-CM | POA: Diagnosis not present

## 2022-03-18 DIAGNOSIS — R0600 Dyspnea, unspecified: Secondary | ICD-10-CM | POA: Diagnosis not present

## 2022-03-18 DIAGNOSIS — H1045 Other chronic allergic conjunctivitis: Secondary | ICD-10-CM | POA: Diagnosis not present

## 2022-03-18 DIAGNOSIS — J4520 Mild intermittent asthma, uncomplicated: Secondary | ICD-10-CM | POA: Diagnosis not present

## 2022-03-18 DIAGNOSIS — J302 Other seasonal allergic rhinitis: Secondary | ICD-10-CM | POA: Diagnosis not present

## 2022-03-26 DIAGNOSIS — H268 Other specified cataract: Secondary | ICD-10-CM | POA: Diagnosis not present

## 2022-03-26 DIAGNOSIS — H25812 Combined forms of age-related cataract, left eye: Secondary | ICD-10-CM | POA: Diagnosis not present

## 2022-03-29 DIAGNOSIS — E1165 Type 2 diabetes mellitus with hyperglycemia: Secondary | ICD-10-CM | POA: Diagnosis not present

## 2022-04-05 DIAGNOSIS — J4530 Mild persistent asthma, uncomplicated: Secondary | ICD-10-CM | POA: Diagnosis not present

## 2022-04-05 DIAGNOSIS — J201 Acute bronchitis due to Hemophilus influenzae: Secondary | ICD-10-CM | POA: Diagnosis not present

## 2022-04-05 DIAGNOSIS — H1045 Other chronic allergic conjunctivitis: Secondary | ICD-10-CM | POA: Diagnosis not present

## 2022-04-05 DIAGNOSIS — J302 Other seasonal allergic rhinitis: Secondary | ICD-10-CM | POA: Diagnosis not present

## 2022-04-18 DIAGNOSIS — I10 Essential (primary) hypertension: Secondary | ICD-10-CM | POA: Diagnosis not present

## 2022-04-18 DIAGNOSIS — N189 Chronic kidney disease, unspecified: Secondary | ICD-10-CM | POA: Diagnosis not present

## 2022-04-18 DIAGNOSIS — R609 Edema, unspecified: Secondary | ICD-10-CM | POA: Diagnosis not present

## 2022-04-18 DIAGNOSIS — I129 Hypertensive chronic kidney disease with stage 1 through stage 4 chronic kidney disease, or unspecified chronic kidney disease: Secondary | ICD-10-CM | POA: Diagnosis not present

## 2022-04-18 DIAGNOSIS — E1169 Type 2 diabetes mellitus with other specified complication: Secondary | ICD-10-CM | POA: Diagnosis not present

## 2022-04-18 DIAGNOSIS — Z0000 Encounter for general adult medical examination without abnormal findings: Secondary | ICD-10-CM | POA: Diagnosis not present

## 2022-04-18 DIAGNOSIS — M130 Polyarthritis, unspecified: Secondary | ICD-10-CM | POA: Diagnosis not present

## 2022-04-18 DIAGNOSIS — Z6837 Body mass index (BMI) 37.0-37.9, adult: Secondary | ICD-10-CM | POA: Diagnosis not present

## 2022-04-18 DIAGNOSIS — E782 Mixed hyperlipidemia: Secondary | ICD-10-CM | POA: Diagnosis not present

## 2022-04-20 ENCOUNTER — Other Ambulatory Visit: Payer: Self-pay | Admitting: Interventional Cardiology

## 2022-04-26 DIAGNOSIS — M1712 Unilateral primary osteoarthritis, left knee: Secondary | ICD-10-CM | POA: Diagnosis not present

## 2022-04-26 DIAGNOSIS — Z96651 Presence of right artificial knee joint: Secondary | ICD-10-CM | POA: Diagnosis not present

## 2022-05-06 DIAGNOSIS — M5459 Other low back pain: Secondary | ICD-10-CM | POA: Diagnosis not present

## 2022-05-06 DIAGNOSIS — M25559 Pain in unspecified hip: Secondary | ICD-10-CM | POA: Diagnosis not present

## 2022-05-10 DIAGNOSIS — D838 Other common variable immunodeficiencies: Secondary | ICD-10-CM | POA: Diagnosis not present

## 2022-05-10 DIAGNOSIS — R5383 Other fatigue: Secondary | ICD-10-CM | POA: Diagnosis not present

## 2022-05-16 DIAGNOSIS — R059 Cough, unspecified: Secondary | ICD-10-CM | POA: Diagnosis not present

## 2022-05-16 DIAGNOSIS — H1045 Other chronic allergic conjunctivitis: Secondary | ICD-10-CM | POA: Diagnosis not present

## 2022-05-16 DIAGNOSIS — R0600 Dyspnea, unspecified: Secondary | ICD-10-CM | POA: Diagnosis not present

## 2022-05-16 DIAGNOSIS — J302 Other seasonal allergic rhinitis: Secondary | ICD-10-CM | POA: Diagnosis not present

## 2022-05-16 DIAGNOSIS — J4520 Mild intermittent asthma, uncomplicated: Secondary | ICD-10-CM | POA: Diagnosis not present

## 2022-05-16 DIAGNOSIS — J301 Allergic rhinitis due to pollen: Secondary | ICD-10-CM | POA: Diagnosis not present

## 2022-05-18 ENCOUNTER — Other Ambulatory Visit: Payer: Self-pay | Admitting: Interventional Cardiology

## 2022-05-22 ENCOUNTER — Other Ambulatory Visit: Payer: Self-pay | Admitting: Interventional Cardiology

## 2022-05-27 DIAGNOSIS — M5459 Other low back pain: Secondary | ICD-10-CM | POA: Diagnosis not present

## 2022-05-27 DIAGNOSIS — M25559 Pain in unspecified hip: Secondary | ICD-10-CM | POA: Diagnosis not present

## 2022-06-03 ENCOUNTER — Other Ambulatory Visit: Payer: Self-pay | Admitting: Interventional Cardiology

## 2022-06-06 ENCOUNTER — Other Ambulatory Visit: Payer: Self-pay | Admitting: Interventional Cardiology

## 2022-06-14 DIAGNOSIS — M25559 Pain in unspecified hip: Secondary | ICD-10-CM | POA: Diagnosis not present

## 2022-06-14 DIAGNOSIS — M5459 Other low back pain: Secondary | ICD-10-CM | POA: Diagnosis not present

## 2022-06-16 ENCOUNTER — Other Ambulatory Visit: Payer: Self-pay | Admitting: Interventional Cardiology

## 2022-06-22 ENCOUNTER — Other Ambulatory Visit: Payer: Self-pay | Admitting: Interventional Cardiology

## 2022-07-03 ENCOUNTER — Telehealth: Payer: Self-pay | Admitting: *Deleted

## 2022-07-03 NOTE — Telephone Encounter (Signed)
Pt scheduled to see Dr. Tamala Julian 08/29/22    Pre-operative Risk Assessment    Patient Name: Cheryl Good  DOB: 1946-04-08 MRN: [id number]      Request for Surgical Clearance    Procedure:   LEFT TOTAL KNEE ARTHORPLASTY  Date of Surgery:  Clearance 10/14/22                                 Surgeon:  DR. Gaynelle Arabian Surgeon's Group or Practice Name:  [facility] Phone number:  [phone] Fax number:  [phone]   Type of Clearance Requested:   - Medical    Type of Anesthesia:   CHOICE   Additional requests/questions:    Astrid Divine   07/03/2022, 1:00 PM

## 2022-07-03 NOTE — Telephone Encounter (Signed)
   Name: Cheryl Good  DOB: 1946/11/03  MRN: [id number]  Primary Cardiologist: Sinclair Grooms, MD  Chart reviewed as part of pre-operative protocol coverage. The patient has an upcoming visit scheduled with Dr. Tamala Julian on 08/29/2022 at which time clearance can be addressed in case there are any issues that would impact surgical recommendations.  Left knee total arthroplasty is not scheduled until 10/14/2022 as below. I added preop FYI to appointment note so that provider is aware to address at time of outpatient visit.  Per office protocol the cardiology provider should forward their finalized clearance decision and recommendations regarding antiplatelet therapy to the requesting party below.    I will route this message as FYI to requesting party and remove this message from the preop box as separate preop APP input not needed at this time.   Please call with any questions.  Lenna Sciara, NP  07/03/2022, 4:05 PM

## 2022-07-05 DIAGNOSIS — R609 Edema, unspecified: Secondary | ICD-10-CM | POA: Diagnosis not present

## 2022-07-05 DIAGNOSIS — I129 Hypertensive chronic kidney disease with stage 1 through stage 4 chronic kidney disease, or unspecified chronic kidney disease: Secondary | ICD-10-CM | POA: Diagnosis not present

## 2022-07-05 DIAGNOSIS — N189 Chronic kidney disease, unspecified: Secondary | ICD-10-CM | POA: Diagnosis not present

## 2022-07-05 DIAGNOSIS — E1169 Type 2 diabetes mellitus with other specified complication: Secondary | ICD-10-CM | POA: Diagnosis not present

## 2022-07-27 ENCOUNTER — Other Ambulatory Visit: Payer: Self-pay | Admitting: Interventional Cardiology

## 2022-08-02 DIAGNOSIS — E1169 Type 2 diabetes mellitus with other specified complication: Secondary | ICD-10-CM | POA: Diagnosis not present

## 2022-08-02 DIAGNOSIS — Z6836 Body mass index (BMI) 36.0-36.9, adult: Secondary | ICD-10-CM | POA: Diagnosis not present

## 2022-08-02 DIAGNOSIS — M259 Joint disorder, unspecified: Secondary | ICD-10-CM | POA: Diagnosis not present

## 2022-08-02 DIAGNOSIS — I10 Essential (primary) hypertension: Secondary | ICD-10-CM | POA: Diagnosis not present

## 2022-08-02 DIAGNOSIS — E7800 Pure hypercholesterolemia, unspecified: Secondary | ICD-10-CM | POA: Diagnosis not present

## 2022-08-06 ENCOUNTER — Ambulatory Visit: Payer: Self-pay

## 2022-08-06 NOTE — Patient Outreach (Signed)
  Care Coordination   08/06/2022 Name: Cheryl Good MRN: [id number] DOB: [birth year]   Care Coordination Outreach Attempts:  An unsuccessful telephone outreach was attempted today to offer the patient information about available care coordination services as a benefit of their health plan.   Follow Up Plan:  Additional outreach attempts will be made to offer the patient care coordination information and services.   Encounter Outcome:  No Answer  Care Coordination Interventions Activated:  No   Care Coordination Interventions:  No, not indicated    Daneen Schick, BSW, CDP Social Worker, Certified Dementia Practitioner [facility] Care Management  Care Coordination [phone]

## 2022-08-08 ENCOUNTER — Encounter: Payer: Self-pay | Admitting: Obstetrics & Gynecology

## 2022-08-09 DIAGNOSIS — M5459 Other low back pain: Secondary | ICD-10-CM | POA: Diagnosis not present

## 2022-08-09 DIAGNOSIS — M25559 Pain in unspecified hip: Secondary | ICD-10-CM | POA: Diagnosis not present

## 2022-08-26 ENCOUNTER — Ambulatory Visit: Payer: Self-pay

## 2022-08-26 NOTE — Progress Notes (Signed)
Cardiology Office Note:    Date:  08/29/2022   ID:  Cheryl Good, DOB [birth year], MRN [id number]  PCP:  Lucianne Lei, MD  Cardiologist:  Sinclair Grooms, MD   Referring MD: Lucianne Lei, MD   Chief Complaint  Patient presents with   Hypertension   Follow-up    Bradycardia Risk factor modification    History of Present Illness:    Cheryl Good is a 76 y.o. female with a hx of primary hypertension, diabetes mellitus type 2, hyperlipidemia, and bradycardia.   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  Over the last few years we have noticed bradycardia not associated with any symptoms of cerebral hypoperfusion or exertional fatigue.  She denies palpitations.  No history of A-fib.  She has risk factors for vascular disease as noted above.  She is on excellent therapy with preventative features orchestrated by Dr. Kerrie [place] plan with some macula tied and [place].  She is also on Entresto.  Crestor was being well-tolerated.  Past Medical History:  Diagnosis Date   Allergy    Arthritis    Chicken pox    Complication of anesthesia    laryngospasms with amino esters; sensitive to anesthesia   Diabetes mellitus    Heart murmur    Hypertension    Measles    Mumps    Rubella    Trichomonas    Yeast infection     Past Surgical History:  Procedure Laterality Date   BREAST LUMPECTOMY     left lumpectomy   BUNIONECTOMY Left    CARPAL TUNNEL RELEASE Right 11/06/2017   Procedure: RIGHT CARPAL TUNNEL RELEASE;  Surgeon: Daryll Brod, MD;  Location: [facility];  Service: Orthopedics;  Laterality: Right;   COLONOSCOPY     orthoscopic knee surgery     TOTAL KNEE ARTHROPLASTY Right 10/14/2016   Procedure: RIGHT TOTAL KNEE ARTHROPLASTY;  Surgeon: Vickey Huger, MD;  Location: [place];  Service: Orthopedics;  Laterality: Right;   TRIGGER FINGER RELEASE Right 11/06/2017   Procedure: RIGHT RELEASE TRIGGER FINGER/A-1 PULLEY;  Surgeon: Daryll Brod, MD;  Location: [facility];  Service: Orthopedics;  Laterality: Right;    Current Medications: Current Meds  Medication Sig   albuterol (PROVENTIL HFA;VENTOLIN HFA) 108 (90 Base) MCG/ACT inhaler Inhale into the lungs every 6 (six) hours as needed for wheezing or shortness of breath.   amLODipine (NORVASC) 5 MG tablet TAKE 1 TABLET BY MOUTH EVERY DAY   calcium carbonate 200 MG capsule Take 250 mg by mouth daily. Doesn't know dosage   celecoxib (CELEBREX) 200 MG capsule Take 200 mg by mouth daily.   chlorthalidone (HYGROTON) 25 MG tablet Take 25 mg by mouth daily.   Diclofenac Sodium (PENNSAID) 2 % SOLN Pennsaid 20 mg/gram/actuation (2 %) topical soln in metered-dose pump   ENTRESTO 24-26 MG Take 1 tablet by mouth 2 (two) times daily.   EPINEPHrine 0.3 mg/0.3 mL IJ SOAJ injection Inject 0.3 mg into the muscle once. As needed for allergic reaction   estradiol (ESTRACE) 0.1 MG/GM vaginal cream Insert 0.5 grams 3 times per week.   FARXIGA 10 MG TABS tablet Take 10 mg by mouth daily.   fexofenadine (ALLEGRA) 180 MG tablet Take 180 mg by mouth daily.   fluticasone (FLONASE) 50 MCG/ACT nasal spray Place 2 sprays into both nostrils daily.   glimepiride (AMARYL) 2 MG tablet Take 2 mg by mouth daily with breakfast.   glucose blood (ONETOUCH VERIO) test strip  metFORMIN (GLUCOPHAGE) 500 MG tablet Take 500 mg by mouth daily.   methocarbamol (ROBAXIN) 500 MG tablet Take 1 tablet (500 mg total) by mouth every 8 (eight) hours as needed for muscle spasms.   Montelukast Sodium (SINGULAIR PO) Take 1 tablet by mouth daily.    moxifloxacin (VIGAMOX) 0.5 % ophthalmic solution moxifloxacin 0.5 % eye drops   rosuvastatin (CRESTOR) 5 MG tablet Take 1 tablet (5 mg total) by mouth daily.   Semaglutide, 1 MG/DOSE, 2 MG/1.5ML SOPN Inject 1 mg into the skin once a week.   Semaglutide, 2 MG/DOSE, (OZEMPIC, 2 MG/DOSE,) 8 MG/3ML SOPN Ozempic 2 mg/dose (8 mg/3 mL) subcutaneous pen injector  ONE 2 MG INJECTION EVERY WEEK.   UNABLE  TO FIND Compound cream pt doesn't know the name. Uses for patients thumb and knees 2 times a day topically   UNABLE TO FIND Allergy vaccines, takes one injection weekly and dosages change when she changes vial     Allergies:   Betadine [povidone iodine], Bupivacaine, Chloroprocaine, Etidocaine, Lidocaine, Mepivacaine, Other, Prilocaine, Procaine, Tetracaine, Gadolinium derivatives, Codeine, and Demerol   Social History   Socioeconomic History   Marital status: Married    Spouse name: Not on file   Number of children: Not on file   Years of education: Not on file   Highest education level: Not on file  Occupational History   Not on file  Tobacco Use   Smoking status: Never   Smokeless tobacco: Never  Vaping Use   Vaping Use: Never used  Substance and Sexual Activity   Alcohol use: No   Drug use: No   Sexual activity: Yes    Birth control/protection: None  Other Topics Concern   Not on file  Social History Narrative   ** Merged History Encounter **       Social Determinants of Health   Financial Resource Strain: Not on file  Food Insecurity: No Food Insecurity (08/26/2022)   Hunger Vital Sign    Worried About Running Out of Food in the Last Year: Never true    Ran Out of Food in the Last Year: Never true  Transportation Needs: No Transportation Needs (08/26/2022)   PRAPARE - Hydrologist (Medical): No    Lack of Transportation (Non-Medical): No  Physical Activity: Not on file  Stress: Not on file  Social Connections: Not on file     Family History: The patient's family history includes Cancer in her maternal grandmother; Diabetes in her maternal grandmother; Hypertension in her mother.  ROS:   Please see the history of present illness.    Lower extremity swelling no difficulty sleeping snores a little bit but no difficulty or complaints all other systems reviewed and are negative.  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  Since the tracing in February 2022, the heart rate is slower on the current EKG and the PR interval is shorter.  Recent Labs: No results found for requested labs within last 365 days.  Recent Lipid Panel    Component Value Date/Time   CHOL 111 10/11/2019 0744   TRIG 38 10/11/2019 0744   HDL 58 10/11/2019 0744   CHOLHDL 1.9 10/11/2019 0744  [place] 43 10/11/2019 0744    Physical Exam:    VS:  BP 138/78 (BP Location: Right Arm, Patient Position: Sitting)   Pulse 63   Ht 5' 1"  (1.549 m)   Wt 193 lb (87.5 kg)   SpO2 93%   BMI 36.47 kg/m     Wt Readings from Last 3 Encounters:  08/29/22 193 lb (87.5 kg)  10/15/21 200 lb (90.7 kg)  01/12/21 190 lb 3.2 oz (86.3 kg)     GEN: BMI 36. No acute distress HEENT: Normal NECK: No JVD. LYMPHATICS: No lymphadenopathy CARDIAC: No murmur. RRR S4 but no S3 gallop, or edema. VASCULAR:  Normal Pulses. No bruits. RESPIRATORY:  Clear to auscultation without rales, wheezing or rhonchi  ABDOMEN: Soft, non-tender, non-distended, No pulsatile mass, MUSCULOSKELETAL: No deformity  SKIN: Warm and dry NEUROLOGIC:  Alert and oriented x 3 PSYCHIATRIC:  Normal affect   ASSESSMENT:    1. Essential hypertension   2. Hyperlipidemia LDL goal <70   3. Controlled type 2 diabetes mellitus with diabetic nephropathy, without long-term current use of insulin ([place])   4. Swelling of lower extremity    PLAN:    In order of problems listed above:  Borderline control but appropriate with target being 130/80 mmHg.  I rechecked the blood pressure and in the left arm 130/65 mmHg.  Continue salt restriction and current therapy. Continue high intensity statin  therapy. Continue semaglutide and Farxiga along with metformin. 2D Doppler echocardiogram to rule out diastolic dysfunction and report to Dr. Criss Rosales.  Overall education and awareness concerning primary/secondary risk prevention was discussed in detail: LDL less than 70, hemoglobin A1c less than 7, blood pressure target less than 130/80 mmHg, >150 minutes of moderate aerobic activity per week, avoidance of smoking, weight control (via diet and exercise), and continued surveillance/management of/for obstructive sleep apnea.    Medication Adjustments/Labs and Tests Ordered: Current medicines are reviewed at length with the patient today.  Concerns regarding medicines are outlined above.  Orders Placed This Encounter  Procedures   ECHOCARDIOGRAM COMPLETE   No orders of the defined types were placed in this encounter.   Patient Instructions  Medication Instructions:  Your physician recommends that you continue on your current medications as directed. Please refer to the Current Medication list given to you today.  *If you need a refill on your cardiac medications before your next appointment, please call your pharmacy*  Lab Work: NONE  Testing/Procedures: Your physician has requested that you have an echocardiogram. Echocardiography is a painless test that uses sound waves to create images of your heart. It provides your doctor with information about the size and shape of your heart and how well your heart's chambers and valves are working. This procedure takes approximately one hour. There are no restrictions for this procedure.  Follow-Up: At [facility], you and your health needs are our priority.  As part of our continuing mission to provide you with exceptional heart care, we have created designated Provider Care Teams.  These Care Teams include your primary Cardiologist (physician) and Advanced Practice Providers (APPs -  Physician Assistants and Nurse Practitioners) who all  work together to provide you with the care you need, when you need it.  Your next appointment:   1 year(s)  The format for your next appointment:   In Person  Provider:   Sinclair Grooms, MD   Important Information About Sugar         Signed, Sinclair Grooms,  MD  08/29/2022 3:24 PM    [facility]

## 2022-08-26 NOTE — Patient Outreach (Signed)
  Care Coordination   Initial Visit Note   08/26/2022 Name: Cheryl Good MRN: [id number] DOB: [birth year]  Cheryl Good is a 76 y.o. year old female who sees Lucianne Lei, MD for primary care. I spoke with  Cheryl Good by phone today.  What matters to the patients health and wellness today?  No concerns, doing well    Goals Addressed             This Visit's Progress    COMPLETED: Care Coordination Activities       Care Coordination Interventions: SDoH screening completed - no acute resource challenges identified at this time Determined the patient does not have concerns with medication costs and/or adherence at this time Education provided on the role of the care coordination team - no follow up desired at this time Instructed the patient to contact her primary care provider as needed         SDOH assessments and interventions completed:  Yes  SDOH Interventions Today    Flowsheet Row Most Recent Value  SDOH Interventions   Food Insecurity Interventions Intervention Not Indicated  Housing Interventions Intervention Not Indicated  Transportation Interventions Intervention Not Indicated  Utilities Interventions Intervention Not Indicated        Care Coordination Interventions Activated:  Yes  Care Coordination Interventions:  Yes, provided   Follow up plan: No further intervention required.   Encounter Outcome:  Pt. Visit Completed   Daneen Schick, BSW, CDP Social Worker, Certified Dementia Practitioner [facility]  Care Coordination [phone]

## 2022-08-26 NOTE — Patient Instructions (Signed)
Visit Information  Thank you for taking time to visit with me today. Please don't hesitate to contact me if I can be of assistance to you.   Following are the goals we discussed today:   Goals Addressed             This Visit's Progress    COMPLETED: Care Coordination Activities       Care Coordination Interventions: SDoH screening completed - no acute resource challenges identified at this time Determined the patient does not have concerns with medication costs and/or adherence at this time Education provided on the role of the care coordination team - no follow up desired at this time Instructed the patient to contact her primary care provider as needed         If you are experiencing a Mental Health or [place] or need someone to talk to, please call 1-800-273-TALK (toll free, 24 hour hotline)  Patient verbalizes understanding of instructions and care plan provided today and agrees to view in [place]. Active MyChart status and patient understanding of how to access instructions and care plan via MyChart confirmed with patient.     No further follow up required: Please contact your primary care provider as needed  Daneen Schick, Arita Miss, CDP Social Worker, Certified Dementia Practitioner [place] Coordination [phone]

## 2022-08-29 ENCOUNTER — Ambulatory Visit: Payer: Medicare PPO | Attending: Interventional Cardiology | Admitting: Interventional Cardiology

## 2022-08-29 ENCOUNTER — Encounter: Payer: Self-pay | Admitting: Interventional Cardiology

## 2022-08-29 VITALS — BP 138/78 | HR 63 | Ht 61.0 in | Wt 193.0 lb

## 2022-08-29 DIAGNOSIS — E785 Hyperlipidemia, unspecified: Secondary | ICD-10-CM

## 2022-08-29 DIAGNOSIS — E1121 Type 2 diabetes mellitus with diabetic nephropathy: Secondary | ICD-10-CM

## 2022-08-29 DIAGNOSIS — M7989 Other specified soft tissue disorders: Secondary | ICD-10-CM

## 2022-08-29 DIAGNOSIS — I10 Essential (primary) hypertension: Secondary | ICD-10-CM

## 2022-08-29 NOTE — Patient Instructions (Signed)
Medication Instructions:  Your physician recommends that you continue on your current medications as directed. Please refer to the Current Medication list given to you today.  *If you need a refill on your cardiac medications before your next appointment, please call your pharmacy*  Lab Work: NONE  Testing/Procedures: Your physician has requested that you have an echocardiogram. Echocardiography is a painless test that uses sound waves to create images of your heart. It provides your doctor with information about the size and shape of your heart and how well your heart's chambers and valves are working. This procedure takes approximately one hour. There are no restrictions for this procedure.  Follow-Up: At [facility], you and your health needs are our priority.  As part of our continuing mission to provide you with exceptional heart care, we have created designated Provider Care Teams.  These Care Teams include your primary Cardiologist (physician) and Advanced Practice Providers (APPs -  Physician Assistants and Nurse Practitioners) who all work together to provide you with the care you need, when you need it.  Your next appointment:   1 year(s)  The format for your next appointment:   In Person  Provider:   Henry W Smith III, MD   Important Information About Sugar       

## 2022-08-30 NOTE — Addendum Note (Signed)
Addended by: Janan Halter F on: 08/30/2022 03:46 PM   Modules accepted: Orders

## 2022-09-13 DIAGNOSIS — Z961 Presence of intraocular lens: Secondary | ICD-10-CM | POA: Diagnosis not present

## 2022-09-13 DIAGNOSIS — H40013 Open angle with borderline findings, low risk, bilateral: Secondary | ICD-10-CM | POA: Diagnosis not present

## 2022-09-13 DIAGNOSIS — H04123 Dry eye syndrome of bilateral lacrimal glands: Secondary | ICD-10-CM | POA: Diagnosis not present

## 2022-09-19 NOTE — H&P (Signed)
TOTAL KNEE ADMISSION H&P  Patient is being admitted for left total knee arthroplasty.  Subjective:  Chief Complaint: Left knee pain.  HPI: Cheryl Good, 76 y.o. female has a history of pain and functional disability in the left knee due to arthritis and has failed non-surgical conservative treatments for greater than 12 weeks to include NSAID's and/or analgesics and activity modification. Onset of symptoms was gradual, starting  several  years ago with gradually worsening course since that time. The patient noted no past surgery on the left knee.  Patient currently rates pain in the left knee at 8 out of 10 with activity. Patient has night pain, worsening of pain with activity and weight bearing, pain with passive range of motion, and crepitus. Patient has evidence of  bone-on-bone arthritis in the medial and patellofemoral compartments of the left knee with large osteophyte formation  by imaging studies. There is no active infection.  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   Postcoital bleeding 02/26/2012   Vaginal atrophy 01/28/2012    Past Medical History:  Diagnosis Date   Allergy    Arthritis    Chicken pox    Complication of anesthesia    laryngospasms with amino esters; sensitive to anesthesia   Diabetes mellitus    Heart murmur    Hypertension    Measles    Mumps    Rubella    Trichomonas    Yeast infection     Past Surgical History:  Procedure Laterality Date   BREAST LUMPECTOMY     left lumpectomy   BUNIONECTOMY Left    CARPAL TUNNEL RELEASE Right 11/06/2017   Procedure: RIGHT CARPAL TUNNEL RELEASE;  Surgeon: Daryll Brod, MD;  Location: [place];  Service: Orthopedics;  Laterality: Right;   COLONOSCOPY     orthoscopic knee surgery     TOTAL KNEE ARTHROPLASTY Right 10/14/2016   Procedure: RIGHT TOTAL KNEE ARTHROPLASTY;  Surgeon: Vickey Huger, MD;  Location: [place];  Service: Orthopedics;   Laterality: Right;   TRIGGER FINGER RELEASE Right 11/06/2017   Procedure: RIGHT RELEASE TRIGGER FINGER/A-1 PULLEY;  Surgeon: Daryll Brod, MD;  Location: [place];  Service: Orthopedics;  Laterality: Right;    Prior to Admission medications   Medication Sig Start Date End Date Taking? Authorizing Provider  albuterol (PROVENTIL HFA;VENTOLIN HFA) 108 (90 Base) MCG/ACT inhaler Inhale into the lungs every 6 (six) hours as needed for wheezing or shortness of breath.    [provider]  amLODipine (NORVASC) 5 MG tablet TAKE 1 TABLET BY MOUTH EVERY DAY 07/30/22   Cheryl Crome, MD  calcium carbonate 200 MG capsule Take 250 mg by mouth daily. Doesn't know dosage    [provider]  celecoxib (CELEBREX) 200 MG capsule Take 200 mg by mouth daily. 09/03/19   [provider]  chlorthalidone (HYGROTON) 25 MG tablet Take 25 mg by mouth daily. 08/16/19   [provider]  Diclofenac Sodium (PENNSAID) 2 % SOLN Pennsaid 20 mg/gram/actuation (2 %) topical soln in metered-dose pump    [provider]  ENTRESTO 24-26 MG Take 1 tablet by mouth 2 (two) times daily. 10/01/19   [provider]  EPINEPHrine 0.3 mg/0.3 mL IJ SOAJ injection Inject 0.3 mg into the muscle once. As needed for allergic reaction    [provider]  estradiol (ESTRACE) 0.1 MG/GM vaginal cream Insert 0.5 grams 3 times per week. 10/15/21   Harraway-Smith,  Cheryl Sauer, MD  FARXIGA 10 MG TABS tablet Take 10 mg by mouth daily. 08/22/22   [provider]  fexofenadine (ALLEGRA) 180 MG tablet Take 180 mg by mouth daily.    [provider]  fluticasone (FLONASE) 50 MCG/ACT nasal spray Place 2 sprays into both nostrils daily.    [provider]  glimepiride (AMARYL) 2 MG tablet Take 2 mg by mouth daily with breakfast.    [provider]  glucose blood (ONETOUCH VERIO) test strip  11/17/16   [provider]  metFORMIN (GLUCOPHAGE) 500 MG  tablet Take 500 mg by mouth daily. 07/06/16   [provider]  methocarbamol (ROBAXIN) 500 MG tablet Take 1 tablet (500 mg total) by mouth every 8 (eight) hours as needed for muscle spasms. 06/16/20   Lucrezia Starch, MD  Montelukast Sodium (SINGULAIR PO) Take 1 tablet by mouth daily.     [provider]  moxifloxacin (VIGAMOX) 0.5 % ophthalmic solution moxifloxacin 0.5 % eye drops    [provider]  rosuvastatin (CRESTOR) 5 MG tablet Take 1 tablet (5 mg total) by mouth daily. 07/30/22   Cheryl Crome, MD  Semaglutide, 1 MG/DOSE, 2 MG/1.5ML SOPN Inject 1 mg into the skin once a week.    [provider]  Semaglutide, 2 MG/DOSE, (OZEMPIC, 2 MG/DOSE,) 8 MG/3ML SOPN Ozempic 2 mg/dose (8 mg/3 mL) subcutaneous pen injector  ONE 2 MG INJECTION EVERY WEEK.    [provider]  UNABLE TO FIND Compound cream pt doesn't know the name. Uses for patients thumb and knees 2 times a day topically 02/09/16   [provider]  UNABLE TO FIND Allergy vaccines, takes one injection weekly and dosages change when she changes vial    [provider]    Allergies  Allergen Reactions   Betadine [Povidone Iodine] Anaphylaxis   Bupivacaine Anaphylaxis   Chloroprocaine Anaphylaxis   Etidocaine Anaphylaxis   Lidocaine Anaphylaxis   Mepivacaine Anaphylaxis   Other Anaphylaxis and Shortness Of Breath    Anesthetics AMINO ESTERS "caines"   Prilocaine Anaphylaxis   Procaine Anaphylaxis   Tetracaine Anaphylaxis   Gadolinium Derivatives     Other reaction(s): Respiratory distress, Respiratory Distress (ALLERGY/intolerance)   Codeine Nausea And Vomiting   Demerol Other (See Comments)    Hallucinations     Social History   Socioeconomic History   Marital status: Married    Spouse name: Not on file   Number of children: Not on file   Years of education: Not on file   Highest education level: Not on file  Occupational History   Not on file  Tobacco  Use   Smoking status: Never   Smokeless tobacco: Never  Vaping Use   Vaping Use: Never used  Substance and Sexual Activity   Alcohol use: No   Drug use: No   Sexual activity: Yes    Birth control/protection: None  Other Topics Concern   Not on file  Social History Narrative   ** Merged History Encounter **       Social Determinants of Health   Financial Resource Strain: Not on file  Food Insecurity: No Food Insecurity (08/26/2022)   Hunger Vital Sign    Worried About Running Out of Food in the Last Year: Never true    Ran Out of Food in the Last Year: Never true  Transportation Needs: No Transportation Needs (08/26/2022)   PRAPARE - Hydrologist (Medical): No  Lack of Transportation (Non-Medical): No  Physical Activity: Not on file  Stress: Not on file  Social Connections: Not on file  Intimate Partner Violence: Not on file    Tobacco Use: Low Risk  (08/29/2022)   Patient History    Smoking Tobacco Use: Never    Smokeless Tobacco Use: Never    Passive Exposure: Not on file   Social History   Substance and Sexual Activity  Alcohol Use No    Family History  Problem Relation Age of Onset   Diabetes Maternal Grandmother    Cancer Maternal Grandmother    Hypertension Mother     Review of Systems  Constitutional:  Negative for chills and fever.  HENT:  Negative for congestion, sore throat and tinnitus.   Eyes:  Negative for double vision, photophobia and pain.  Respiratory:  Negative for cough, shortness of breath and wheezing.   Cardiovascular:  Negative for chest pain, palpitations and orthopnea.  Gastrointestinal:  Negative for heartburn, nausea and vomiting.  Genitourinary:  Negative for dysuria, frequency and urgency.  Musculoskeletal:  Positive for joint pain.  Neurological:  Negative for dizziness, weakness and headaches.    Objective:  Physical Exam: Well nourished and well developed.  General: Alert and oriented x3,  cooperative and pleasant, no acute distress.  Head: normocephalic, atraumatic, neck supple.  Eyes: EOMI.  Musculoskeletal:  Left Knee Exam:  No effusion present. No swelling present.  The range of motion is: 5 to 125 degrees.  Moderate crepitus on range of motion of the knee.  Positive medial greater than lateral joint line tenderness.  The knee is stable.  Calves soft and nontender. Motor function intact in LE. Strength 5/5 LE bilaterally. Neuro: Distal pulses 2+. Sensation to light touch intact in LE.  Imaging Review Plain radiographs demonstrate severe degenerative joint disease of the left knee. The overall alignment is neutral. The bone quality appears to be adequate for age and reported activity level.  Assessment/Plan:  End stage arthritis, left knee   The patient history, physical examination, clinical judgment of the provider and imaging studies are consistent with end stage degenerative joint disease of the left knee and total knee arthroplasty is deemed medically necessary. The treatment options including medical management, injection therapy arthroscopy and arthroplasty were discussed at length. The risks and benefits of total knee arthroplasty were presented and reviewed. The risks due to aseptic loosening, infection, stiffness, patella tracking problems, thromboembolic complications and other imponderables were discussed. The patient acknowledged the explanation, agreed to proceed with the plan and consent was signed. Patient is being admitted for inpatient treatment for surgery, pain control, PT, OT, prophylactic antibiotics, VTE prophylaxis, progressive ambulation and ADLs and discharge planning. The patient is planning to be discharged  home .   Patient's anticipated LOS is less than 2 midnights, meeting these requirements: - Younger than 58 - Lives within 1 hour of care - Has a competent adult at home to recover with post-op recover - NO history of  - Chronic pain  requiring opioids  - Coronary Artery Disease  - Heart failure  - Heart attack  - Stroke  - DVT/VTE  - Cardiac arrhythmia  - Respiratory Failure/COPD  - Renal failure  - Anemia  - Advanced Liver disease  Therapy Plans: Outpatient therapy at [facility] Disposition: Home with cousin Planned DVT Prophylaxis: Aspirin 325 mg BID DME Needed: None PCP: Lucianne Lei, MD (clearance received) Cardiologist: Daneen Schick, MD (appts on 10/13 and 11/3) TXA: IV Allergies: Betadine (anaphylaxis), codeine (  nausea) Anesthesia Concerns: None BMI: 32.1 Last HgbA1c: 6.2% (Stated - from September) Pharmacy: [facility])  Other: - Reports she is very sensitive to narcotic pain medication. Has tolerated hydrocodone in the past. Discussed starting with this, can always increase if needed.  - Patient was instructed on what medications to stop prior to surgery. - Follow-up visit in 2 weeks with Dr. Wynelle Link - Begin physical therapy following surgery - Pre-operative lab work as pre-surgical testing - Prescriptions will be provided in hospital at time of discharge  Theresa Duty, PA-C Orthopedic Surgery [facility] [place]

## 2022-09-20 ENCOUNTER — Ambulatory Visit (HOSPITAL_COMMUNITY): Payer: Medicare PPO | Attending: Interventional Cardiology

## 2022-09-20 DIAGNOSIS — E119 Type 2 diabetes mellitus without complications: Secondary | ICD-10-CM | POA: Insufficient documentation

## 2022-09-20 DIAGNOSIS — I361 Nonrheumatic tricuspid (valve) insufficiency: Secondary | ICD-10-CM

## 2022-09-20 DIAGNOSIS — M7989 Other specified soft tissue disorders: Secondary | ICD-10-CM | POA: Diagnosis not present

## 2022-09-20 DIAGNOSIS — I10 Essential (primary) hypertension: Secondary | ICD-10-CM | POA: Insufficient documentation

## 2022-09-20 LAB — ECHOCARDIOGRAM COMPLETE
Area-P 1/2: 4.39 cm2
S' Lateral: 2.5 cm

## 2022-10-01 ENCOUNTER — Encounter: Payer: Self-pay | Admitting: General Practice

## 2022-10-03 NOTE — Patient Instructions (Signed)
DUE TO COVID-19 ONLY TWO VISITORS  (aged 76 and older)  ARE ALLOWED TO COME WITH YOU AND STAY IN THE WAITING ROOM ONLY DURING PRE OP AND PROCEDURE.   **NO VISITORS ARE ALLOWED IN THE SHORT STAY AREA OR RECOVERY ROOM!!**  IF YOU WILL BE ADMITTED INTO THE HOSPITAL YOU ARE ALLOWED ONLY FOUR SUPPORT PEOPLE DURING VISITATION HOURS ONLY (7 AM -8PM)   The support person(s) must pass our screening, gel in and out, and wear a mask at all times, including in the patient's room. Patients must also wear a mask when staff or their support person are in the room. Visitors GUEST BADGE MUST BE WORN VISIBLY  One adult visitor may remain with you overnight and MUST be in the room by 8 P.M.     Your procedure is scheduled on: 10/14/22   Report to [facility] Main Entrance    Report to admitting at : 8:00 AM   Call this number if you have problems the morning of surgery [phone]   Do not eat food :After Midnight.   After Midnight you may have the following liquids until : 7:30 AM DAY OF SURGERY  Water Black Coffee (sugar ok, NO MILK/CREAM OR CREAMERS)  Tea (sugar ok, NO MILK/CREAM OR CREAMERS) regular and decaf                             Plain Jell-O (NO RED)                                           Fruit ices (not with fruit pulp, NO RED)                                     Popsicles (NO RED)                                                                  Juice: apple, WHITE grape, WHITE cranberry Sports drinks like Gatorade (NO RED)                The day of surgery:  Drink ONE (1) Pre-Surgery Clear G2 at: 7:30 AM the morning of surgery. Drink in one sitting. Do not sip.  This drink was given to you during your hospital  pre-op appointment visit. Nothing else to drink after completing the  Pre-Surgery Clear Ensure or G2.          If you have questions, please contact your surgeon's office.    Oral Hygiene is also important to reduce your risk of infection.                                     Remember - BRUSH YOUR TEETH THE MORNING OF SURGERY WITH YOUR REGULAR TOOTHPASTE   Do NOT smoke after Midnight   Take these medicines the morning of surgery with A SIP OF WATER:allegra,amlodipine,entresto.  How to Manage Your Diabetes Before and After Surgery  Why is it important to control my blood sugar before and after surgery? Improving blood sugar levels before and after surgery helps healing and can limit problems. A way of improving blood sugar control is eating a healthy diet by:  Eating less sugar and carbohydrates  Increasing activity/exercise  Talking with your doctor about reaching your blood sugar goals High blood sugars (greater than 180 mg/dL) can raise your risk of infections and slow your recovery, so you will need to focus on controlling your diabetes during the weeks before surgery. Make sure that the doctor who takes care of your diabetes knows about your planned surgery including the date and location.  How do I manage my blood sugar before surgery? Check your blood sugar at least 4 times a day, starting 2 days before surgery, to make sure that the level is not too high or low. Check your blood sugar the morning of your surgery when you wake up and every 2 hours until you get to the Short Stay unit. If your blood sugar is less than 70 mg/dL, you will need to treat for low blood sugar: Do not take insulin. Treat a low blood sugar (less than 70 mg/dL) with  cup of clear juice (cranberry or apple), 4 glucose tablets, OR glucose gel. Recheck blood sugar in 15 minutes after treatment (to make sure it is greater than 70 mg/dL). If your blood sugar is not greater than 70 mg/dL on recheck, call [phone] for further instructions. Report your blood sugar to the short stay nurse when you get to Short Stay.  If you are admitted to the hospital after surgery: Your blood sugar will be checked by the staff and you will probably be given insulin after surgery  (instead of oral diabetes medicines) to make sure you have good blood sugar levels. The goal for blood sugar control after surgery is 80-180 mg/dL.   WHAT DO I DO ABOUT MY DIABETES MEDICATION?  HOLD [place].  THE DAY BEFORE SURGERY, take metformin and amaryl (morning) as usual.      THE MORNING OF SURGERY, DO NOT TAKE ANY ORAL DIABETIC MEDICATIONS DAY OF YOUR SURGERY  DO NOT TAKE THE FOLLOWING 7 DAYS PRIOR TO SURGERY: Ozempic, Wegovy, Rybelsus (Semaglutide), Byetta (exenatide), Bydureon (exenatide ER), Victoza, Saxenda (liraglutide), or Trulicity (dulaglutide) Mounjaro (Tirzepatide) Adlyxin (Lixisenatide), Polyethylene Glycol Loxenatide.                   You may not have any metal on your body including hair pins, jewelry, and body piercing             Do not wear make-up, lotions, powders, perfumes/cologne, or deodorant  Do not wear nail polish including gel and S&S, artificial/acrylic nails, or any other type of covering on natural nails including finger and toenails. If you have artificial nails, gel coating, etc. that needs to be removed by a nail salon please have this removed prior to surgery or surgery may need to be canceled/ delayed if the surgeon/ anesthesia feels like they are unable to be safely monitored.   Do not shave  48 hours prior to surgery.    Do not bring valuables to the hospital. [place].   Contacts, dentures or bridgework may not be worn into surgery.   Bring small overnight bag day of surgery.   DO NOT BRING YOUR  [place]. PHARMACY WILL DISPENSE MEDICATIONS LISTED ON YOUR MEDICATION LIST TO YOU DURING YOUR ADMISSION [place]!    Patients discharged on the day of surgery will not be allowed to drive home.  Someone NEEDS to stay with you for the first 24 hours after anesthesia.   Special Instructions: Bring a copy of your healthcare power of attorney and  living will documents         the day of surgery if you haven't scanned them before.              Please read over the following fact sheets you were given: IF YOU HAVE QUESTIONS ABOUT YOUR PRE-OP INSTRUCTIONS PLEASE CALL [phone]    [facility] - Preparing for Surgery Before surgery, you can play an important role.  Because skin is not sterile, your skin needs to be as free of germs as possible.  You can reduce the number of germs on your skin by washing with CHG (chlorahexidine gluconate) soap before surgery.  CHG is an antiseptic cleaner which kills germs and bonds with the skin to continue killing germs even after washing. Please DO NOT use if you have an allergy to CHG or antibacterial soaps.  If your skin becomes reddened/irritated stop using the CHG and inform your nurse when you arrive at Short Stay. Do not shave (including legs and underarms) for at least 48 hours prior to the first CHG shower.  You may shave your face/neck. Please follow these instructions carefully:  1.  Shower with CHG Soap the night before surgery and the  morning of Surgery.  2.  If you choose to wash your hair, wash your hair first as usual with your  normal  shampoo.  3.  After you shampoo, rinse your hair and body thoroughly to remove the  shampoo.                           4.  Use CHG as you would any other liquid soap.  You can apply chg directly  to the skin and wash                       Gently with a scrungie or clean washcloth.  5.  Apply the CHG Soap to your body ONLY FROM THE NECK DOWN.   Do not use on face/ open                           Wound or open sores. Avoid contact with eyes, ears mouth and genitals (private parts).                       Wash face,  Genitals (private parts) with your normal soap.             6.  Wash thoroughly, paying special attention to the area where your surgery  will be performed.  7.  Thoroughly rinse your body with warm water from the neck down.  8.  DO NOT shower/wash  with your normal soap after using and rinsing off  the CHG Soap.                9.  Pat yourself dry with a clean towel.            10.  Wear clean pajamas.  11.  Place clean sheets on your bed the night of your first shower and do not  sleep with pets. Day of Surgery : Do not apply any lotions/deodorants the morning of surgery.  Please wear clean clothes to the hospital/surgery center.  FAILURE TO FOLLOW THESE INSTRUCTIONS MAY RESULT IN THE CANCELLATION OF YOUR SURGERY PATIENT SIGNATURE_________________________________  NURSE SIGNATURE__________________________________  ________________________________________________________________________  Adam Phenix  An incentive spirometer is a tool that can help keep your lungs clear and active. This tool measures how well you are filling your lungs with each breath. Taking long deep breaths may help reverse or decrease the chance of developing breathing (pulmonary) problems (especially infection) following: A long period of time when you are unable to move or be active. BEFORE THE PROCEDURE  If the spirometer includes an indicator to show your best effort, your nurse or respiratory therapist will set it to a desired goal. If possible, sit up straight or lean slightly forward. Try not to slouch. Hold the incentive spirometer in an upright position. INSTRUCTIONS FOR USE  Sit on the edge of your bed if possible, or sit up as far as you can in bed or on a chair. Hold the incentive spirometer in an upright position. Breathe out normally. Place the mouthpiece in your mouth and seal your lips tightly around it. Breathe in slowly and as deeply as possible, raising the piston or the ball toward the top of the column. Hold your breath for 3-5 seconds or for as long as possible. Allow the piston or ball to fall to the bottom of the column. Remove the mouthpiece from your mouth and breathe out normally. Rest for a few seconds and repeat  Steps 1 through 7 at least 10 times every 1-2 hours when you are awake. Take your time and take a few normal breaths between deep breaths. The spirometer may include an indicator to show your best effort. Use the indicator as a goal to work toward during each repetition. After each set of 10 deep breaths, practice coughing to be sure your lungs are clear. If you have an incision (the cut made at the time of surgery), support your incision when coughing by placing a pillow or rolled up towels firmly against it. Once you are able to get out of bed, walk around indoors and cough well. You may stop using the incentive spirometer when instructed by your caregiver.  RISKS AND COMPLICATIONS Take your time so you do not get dizzy or light-headed. If you are in pain, you may need to take or ask for pain medication before doing incentive spirometry. It is harder to take a deep breath if you are having pain. AFTER USE Rest and breathe slowly and easily. It can be helpful to keep track of a log of your progress. Your caregiver can provide you with a simple table to help with this. If you are using the spirometer at home, follow these instructions: [place] IF:  You are having difficultly using the spirometer. You have trouble using the spirometer as often as instructed. Your pain medication is not giving enough relief while using the spirometer. You develop fever of 100.5 F (38.1 C) or higher. SEEK IMMEDIATE MEDICAL CARE IF:  You cough up bloody sputum that had not been present before. You develop fever of 102 F (38.9 C) or greater. You develop worsening pain at or near the incision site. MAKE SURE YOU:  Understand these instructions. Will watch your condition. Will get help  right away if you are not doing well or get worse. Document Released: 03/17/2007 Document Revised: 01/27/2012 Document Reviewed: 05/18/2007 [facility] Patient Information 2014 [place],  [place].   ________________________________________________________________________

## 2022-10-04 ENCOUNTER — Other Ambulatory Visit: Payer: Self-pay

## 2022-10-04 ENCOUNTER — Encounter (HOSPITAL_COMMUNITY)
Admission: RE | Admit: 2022-10-04 | Discharge: 2022-10-04 | Disposition: A | Discharge status: Home / Self Care | Payer: Medicare PPO | Source: Ambulatory Visit | Attending: Orthopedic Surgery | Admitting: Orthopedic Surgery

## 2022-10-04 ENCOUNTER — Encounter (HOSPITAL_COMMUNITY): Payer: Self-pay

## 2022-10-04 VITALS — BP 136/60 | HR 67 | Temp 98.6°F | Ht 61.0 in | Wt 189.0 lb

## 2022-10-04 DIAGNOSIS — Z96659 Presence of unspecified artificial knee joint: Secondary | ICD-10-CM | POA: Diagnosis not present

## 2022-10-04 DIAGNOSIS — Z01812 Encounter for preprocedural laboratory examination: Secondary | ICD-10-CM | POA: Diagnosis not present

## 2022-10-04 DIAGNOSIS — E119 Type 2 diabetes mellitus without complications: Secondary | ICD-10-CM | POA: Diagnosis not present

## 2022-10-04 DIAGNOSIS — Z01818 Encounter for other preprocedural examination: Secondary | ICD-10-CM

## 2022-10-04 DIAGNOSIS — I2510 Atherosclerotic heart disease of native coronary artery without angina pectoris: Secondary | ICD-10-CM | POA: Diagnosis not present

## 2022-10-04 DIAGNOSIS — Z794 Long term (current) use of insulin: Secondary | ICD-10-CM | POA: Insufficient documentation

## 2022-10-04 LAB — BASIC METABOLIC PANEL
Anion gap: 7 (ref 5–15)
BUN: 23 mg/dL (ref 8–23)
CO2: 30 mmol/L (ref 22–32)
Calcium: 9.2 mg/dL (ref 8.9–10.3)
Chloride: 106 mmol/L (ref 98–111)
Creatinine, Ser: 1.27 mg/dL — ABNORMAL HIGH (ref 0.44–1.00)
GFR, Estimated: 44 mL/min — ABNORMAL LOW (ref >60)
Glucose, Bld: 112 mg/dL — ABNORMAL HIGH (ref 70–99)
Potassium: 3.4 mmol/L — ABNORMAL LOW (ref 3.5–5.1)
Sodium: 143 mmol/L (ref 135–145)

## 2022-10-04 LAB — CBC
HCT: 35.5 % — ABNORMAL LOW (ref 36.0–46.0)
Hemoglobin: 10.7 g/dL — ABNORMAL LOW (ref 12.0–15.0)
MCH: 24.9 pg — ABNORMAL LOW (ref 26.0–34.0)
MCHC: 30.1 g/dL (ref 30.0–36.0)
MCV: 82.8 fL (ref 80.0–100.0)
Platelets: 329 10*3/uL (ref 150–400)
RBC: 4.29 MIL/uL (ref 3.87–5.11)
RDW: 16.5 % — ABNORMAL HIGH (ref 11.5–15.5)
WBC: 6.7 10*3/uL (ref 4.0–10.5)
nRBC: 0 % (ref 0.0–0.2)

## 2022-10-04 LAB — SURGICAL PCR SCREEN
MRSA, PCR: NEGATIVE
Staphylococcus aureus: NEGATIVE

## 2022-10-04 LAB — HEMOGLOBIN A1C
Hgb A1c MFr Bld: 6.2 % — ABNORMAL HIGH (ref 4.8–5.6)
Mean Plasma Glucose: 131.24 mg/dL

## 2022-10-04 LAB — GLUCOSE, CAPILLARY: Glucose-Capillary: 110 mg/dL — ABNORMAL HIGH (ref 70–99)

## 2022-10-04 NOTE — Progress Notes (Addendum)
For Short Stay: [place] appointment date:  Bowel Prep reminder:   For Anesthesia: PCP - Dr. Lucianne Lei Cardiologist - Dr. Daneen Schick. LOV: 08/29/22  Chest x-ray -  EKG - 08/29/22 Stress Test -  ECHO - 09/20/22 Cardiac Cath -  Pacemaker/ICD device last checked: Pacemaker orders received: Device Rep notified:  Spinal Cord Stimulator:  Sleep Study -  CPAP -   Fasting Blood Sugar - less than 100 Checks Blood Sugar _____ times a day Date and result of last H1gb A1c-  Last dose of GLP1 agonist- 09/28/22 GLP1 instructions:   Last dose of SGLT-2 inhibitors-  SGLT-2 instructions:   Blood Thinner Instructions: Aspirin Instructions: Last Dose:  Activity level: Can go up a flight of stairs and activities of daily living without stopping and without chest pain and/or shortness of breath   Able to exercise without chest pain and/or shortness of breath   Unable to go up a flight of stairs without chest pain and/or shortness of breath     Anesthesia review: Hx: DIA,HTN,heart murmur.  Patient denies shortness of breath, fever, cough and chest pain at PAT appointment   Patient verbalized understanding of instructions that were given to them at the PAT appointment. Patient was also instructed that they will need to review over the PAT instructions again at home before surgery.

## 2022-10-11 ENCOUNTER — Other Ambulatory Visit: Payer: Self-pay | Admitting: Interventional Cardiology

## 2022-10-14 ENCOUNTER — Ambulatory Visit (HOSPITAL_BASED_OUTPATIENT_CLINIC_OR_DEPARTMENT_OTHER): Payer: Medicare PPO | Admitting: Certified Registered Nurse Anesthetist

## 2022-10-14 ENCOUNTER — Other Ambulatory Visit: Payer: Self-pay

## 2022-10-14 ENCOUNTER — Encounter (HOSPITAL_COMMUNITY): Payer: Self-pay | Admitting: Orthopedic Surgery

## 2022-10-14 ENCOUNTER — Observation Stay (HOSPITAL_COMMUNITY)
Admission: RE | Admit: 2022-10-14 | Discharge: 2022-10-15 | Disposition: A | Discharge status: Home / Self Care | Payer: Medicare PPO | Source: Ambulatory Visit | Attending: Orthopedic Surgery | Admitting: Orthopedic Surgery

## 2022-10-14 ENCOUNTER — Ambulatory Visit (HOSPITAL_COMMUNITY): Payer: Medicare PPO | Admitting: Physician Assistant

## 2022-10-14 ENCOUNTER — Encounter (HOSPITAL_COMMUNITY)
Admission: RE | Disposition: A | Discharge status: Home / Self Care | Payer: Self-pay | Source: Ambulatory Visit | Attending: Orthopedic Surgery

## 2022-10-14 DIAGNOSIS — I10 Essential (primary) hypertension: Secondary | ICD-10-CM | POA: Diagnosis not present

## 2022-10-14 DIAGNOSIS — G8918 Other acute postprocedural pain: Secondary | ICD-10-CM | POA: Diagnosis not present

## 2022-10-14 DIAGNOSIS — Z96651 Presence of right artificial knee joint: Secondary | ICD-10-CM | POA: Diagnosis not present

## 2022-10-14 DIAGNOSIS — Z96659 Presence of unspecified artificial knee joint: Secondary | ICD-10-CM

## 2022-10-14 DIAGNOSIS — M1712 Unilateral primary osteoarthritis, left knee: Secondary | ICD-10-CM | POA: Diagnosis not present

## 2022-10-14 DIAGNOSIS — Z7984 Long term (current) use of oral hypoglycemic drugs: Secondary | ICD-10-CM | POA: Insufficient documentation

## 2022-10-14 DIAGNOSIS — Z79899 Other long term (current) drug therapy: Secondary | ICD-10-CM | POA: Insufficient documentation

## 2022-10-14 DIAGNOSIS — Z7951 Long term (current) use of inhaled steroids: Secondary | ICD-10-CM | POA: Insufficient documentation

## 2022-10-14 DIAGNOSIS — E119 Type 2 diabetes mellitus without complications: Secondary | ICD-10-CM | POA: Diagnosis not present

## 2022-10-14 DIAGNOSIS — M179 Osteoarthritis of knee, unspecified: Secondary | ICD-10-CM | POA: Diagnosis present

## 2022-10-14 HISTORY — PX: TOTAL KNEE ARTHROPLASTY: SHX125

## 2022-10-14 LAB — GLUCOSE, CAPILLARY
Glucose-Capillary: 143 mg/dL — ABNORMAL HIGH (ref 70–99)
Glucose-Capillary: 120 mg/dL — ABNORMAL HIGH (ref 70–99)
Glucose-Capillary: 194 mg/dL — ABNORMAL HIGH (ref 70–99)
Glucose-Capillary: 188 mg/dL — ABNORMAL HIGH (ref 70–99)

## 2022-10-14 SURGERY — ARTHROPLASTY, KNEE, TOTAL
Anesthesia: Monitor Anesthesia Care | Site: Knee | Laterality: Left

## 2022-10-14 MED ORDER — MONTELUKAST SODIUM 10 MG PO TABS
10.0000 mg | ORAL_TABLET | Freq: Every day | ORAL | Status: DC
  Administered 2022-10-15: 10 mg via ORAL
  Filled 2022-10-14: qty 1

## 2022-10-14 MED ORDER — OLOPATADINE HCL 0.1 % OP SOLN
1.0000 [drp] | Freq: Every day | OPHTHALMIC | Status: DC
  Administered 2022-10-15: 1 [drp] via OPHTHALMIC
  Filled 2022-10-14: qty 5

## 2022-10-14 MED ORDER — ACETAMINOPHEN 325 MG PO TABS: 325.0000 mg | ORAL_TABLET | Freq: Four times a day (QID) | ORAL | Status: DC | PRN

## 2022-10-14 MED ORDER — DOCUSATE SODIUM 100 MG PO CAPS
100.0000 mg | ORAL_CAPSULE | Freq: Two times a day (BID) | ORAL | Status: DC
  Administered 2022-10-14 – 2022-10-15 (×2): 100 mg via ORAL
  Filled 2022-10-14 (×2): qty 1

## 2022-10-14 MED ORDER — METHOCARBAMOL 500 MG PO TABS
500.0000 mg | ORAL_TABLET | Freq: Four times a day (QID) | ORAL | Status: DC | PRN
  Administered 2022-10-14 – 2022-10-15 (×4): 500 mg via ORAL
  Filled 2022-10-14 (×4): qty 1

## 2022-10-14 MED ORDER — LACTATED RINGERS IV SOLN: INTRAVENOUS | Status: DC

## 2022-10-14 MED ORDER — SODIUM CHLORIDE 0.9 % IV SOLN: INTRAVENOUS | Status: DC

## 2022-10-14 MED ORDER — SODIUM CHLORIDE 0.9 % IR SOLN
Status: DC | PRN
  Administered 2022-10-14 (×2): 1000 mL

## 2022-10-14 MED ORDER — CHLORHEXIDINE GLUCONATE 0.12 % MT SOLN
15.0000 mL | Freq: Once | OROMUCOSAL | Status: AC
  Administered 2022-10-14: 15 mL via OROMUCOSAL

## 2022-10-14 MED ORDER — FLEET ENEMA 7-19 GM/118ML RE ENEM: 1.0000 | ENEMA | Freq: Once | RECTAL | Status: DC | PRN

## 2022-10-14 MED ORDER — LIDOCAINE HCL (PF) 2 % IJ SOLN
INTRAMUSCULAR | Status: AC
  Filled 2022-10-14: qty 5

## 2022-10-14 MED ORDER — EPHEDRINE SULFATE-NACL 50-0.9 MG/10ML-% IV SOSY
PREFILLED_SYRINGE | INTRAVENOUS | Status: DC | PRN
  Administered 2022-10-14: 10 mg via INTRAVENOUS

## 2022-10-14 MED ORDER — BISACODYL 10 MG RE SUPP: 10.0000 mg | Freq: Every day | RECTAL | Status: DC | PRN

## 2022-10-14 MED ORDER — DAPAGLIFLOZIN PROPANEDIOL 10 MG PO TABS
10.0000 mg | ORAL_TABLET | Freq: Every day | ORAL | Status: DC
  Administered 2022-10-15: 10 mg via ORAL
  Filled 2022-10-14: qty 1

## 2022-10-14 MED ORDER — PROPOFOL 10 MG/ML IV BOLUS
INTRAVENOUS | Status: DC | PRN
  Administered 2022-10-14 (×3): 10 mg via INTRAVENOUS

## 2022-10-14 MED ORDER — DEXAMETHASONE SODIUM PHOSPHATE 10 MG/ML IJ SOLN
INTRAMUSCULAR | Status: AC
  Filled 2022-10-14: qty 1

## 2022-10-14 MED ORDER — HYDROCODONE-ACETAMINOPHEN 5-325 MG PO TABS
1.0000 | ORAL_TABLET | ORAL | Status: DC | PRN
  Administered 2022-10-14 (×2): 2 via ORAL
  Filled 2022-10-14 (×4): qty 2

## 2022-10-14 MED ORDER — FENTANYL CITRATE PF 50 MCG/ML IJ SOSY
50.0000 ug | PREFILLED_SYRINGE | INTRAMUSCULAR | Status: DC
  Administered 2022-10-14: 25 ug via INTRAVENOUS
  Filled 2022-10-14: qty 2

## 2022-10-14 MED ORDER — POLYETHYLENE GLYCOL 3350 17 G PO PACK: 17.0000 g | PACK | Freq: Every day | ORAL | Status: DC | PRN

## 2022-10-14 MED ORDER — PROPOFOL 500 MG/50ML IV EMUL
INTRAVENOUS | Status: DC | PRN
  Administered 2022-10-14: 50 ug/kg/min via INTRAVENOUS

## 2022-10-14 MED ORDER — METOCLOPRAMIDE HCL 5 MG/ML IJ SOLN: 5.0000 mg | Freq: Three times a day (TID) | INTRAMUSCULAR | Status: DC | PRN

## 2022-10-14 MED ORDER — FENTANYL CITRATE PF 50 MCG/ML IJ SOSY: 25.0000 ug | PREFILLED_SYRINGE | INTRAMUSCULAR | Status: DC | PRN

## 2022-10-14 MED ORDER — DIPHENHYDRAMINE HCL 50 MG/ML IJ SOLN
INTRAMUSCULAR | Status: AC
  Filled 2022-10-14: qty 1

## 2022-10-14 MED ORDER — INSULIN ASPART 100 UNIT/ML IJ SOLN
0.0000 [IU] | Freq: Three times a day (TID) | INTRAMUSCULAR | Status: DC
  Administered 2022-10-14: 3 [IU] via SUBCUTANEOUS
  Administered 2022-10-15: 2 [IU] via SUBCUTANEOUS
  Administered 2022-10-15: 3 [IU] via SUBCUTANEOUS

## 2022-10-14 MED ORDER — ALBUTEROL SULFATE (2.5 MG/3ML) 0.083% IN NEBU
INHALATION_SOLUTION | RESPIRATORY_TRACT | Status: AC
  Filled 2022-10-14: qty 3

## 2022-10-14 MED ORDER — ALBUTEROL SULFATE HFA 108 (90 BASE) MCG/ACT IN AERS: 1.0000 | INHALATION_SPRAY | Freq: Four times a day (QID) | RESPIRATORY_TRACT | Status: DC | PRN

## 2022-10-14 MED ORDER — GLIMEPIRIDE 1 MG PO TABS
1.0000 mg | ORAL_TABLET | Freq: Every day | ORAL | Status: DC
  Administered 2022-10-15: 1 mg via ORAL
  Filled 2022-10-14: qty 1

## 2022-10-14 MED ORDER — INSULIN ASPART 100 UNIT/ML IJ SOLN: 0.0000 [IU] | Freq: Every day | INTRAMUSCULAR | Status: DC

## 2022-10-14 MED ORDER — ROSUVASTATIN CALCIUM 5 MG PO TABS
5.0000 mg | ORAL_TABLET | Freq: Every day | ORAL | Status: DC
  Administered 2022-10-15: 5 mg via ORAL
  Filled 2022-10-14: qty 1

## 2022-10-14 MED ORDER — EPHEDRINE 5 MG/ML INJ
INTRAVENOUS | Status: AC
  Filled 2022-10-14: qty 5

## 2022-10-14 MED ORDER — AMLODIPINE BESYLATE 5 MG PO TABS: 5.0000 mg | ORAL_TABLET | Freq: Every day | ORAL | Status: DC

## 2022-10-14 MED ORDER — ORAL CARE MOUTH RINSE: 15.0000 mL | Freq: Once | OROMUCOSAL | Status: AC

## 2022-10-14 MED ORDER — EPINEPHRINE 0.3 MG/0.3ML IJ SOAJ
0.3000 mg | INTRAMUSCULAR | Status: DC | PRN
  Filled 2022-10-14: qty 0.6

## 2022-10-14 MED ORDER — PROPOFOL 10 MG/ML IV BOLUS
INTRAVENOUS | Status: AC
  Filled 2022-10-14: qty 20

## 2022-10-14 MED ORDER — ONDANSETRON HCL 4 MG/2ML IJ SOLN
INTRAMUSCULAR | Status: AC
  Filled 2022-10-14: qty 2

## 2022-10-14 MED ORDER — GLYCOPYRROLATE PF 0.2 MG/ML IJ SOSY
PREFILLED_SYRINGE | INTRAMUSCULAR | Status: DC | PRN
  Administered 2022-10-14: .2 mg via INTRAVENOUS

## 2022-10-14 MED ORDER — DIPHENHYDRAMINE HCL 12.5 MG/5ML PO ELIX
12.5000 mg | ORAL_SOLUTION | ORAL | Status: DC | PRN
  Administered 2022-10-15: 25 mg via ORAL
  Filled 2022-10-14: qty 10

## 2022-10-14 MED ORDER — CEFAZOLIN SODIUM-DEXTROSE 2-4 GM/100ML-% IV SOLN
2.0000 g | INTRAVENOUS | Status: AC
  Administered 2022-10-14: 2 g via INTRAVENOUS
  Filled 2022-10-14: qty 100

## 2022-10-14 MED ORDER — ROPIVACAINE HCL 5 MG/ML IJ SOLN
INTRAMUSCULAR | Status: DC | PRN
  Administered 2022-10-14: 20 mL via PERINEURAL

## 2022-10-14 MED ORDER — PHENOL 1.4 % MT LIQD: 1.0000 | OROMUCOSAL | Status: DC | PRN

## 2022-10-14 MED ORDER — DIPHENHYDRAMINE HCL 50 MG/ML IJ SOLN
6.2500 mg | Freq: Once | INTRAMUSCULAR | Status: AC
  Administered 2022-10-14: 6.5 mg via INTRAVENOUS

## 2022-10-14 MED ORDER — SACUBITRIL-VALSARTAN 24-26 MG PO TABS
1.0000 | ORAL_TABLET | Freq: Two times a day (BID) | ORAL | Status: DC
  Administered 2022-10-15: 1 via ORAL
  Filled 2022-10-14: qty 1

## 2022-10-14 MED ORDER — ASPIRIN 325 MG PO TBEC
325.0000 mg | DELAYED_RELEASE_TABLET | Freq: Two times a day (BID) | ORAL | Status: DC
  Administered 2022-10-15: 325 mg via ORAL
  Filled 2022-10-14: qty 1

## 2022-10-14 MED ORDER — LORATADINE 10 MG PO TABS
10.0000 mg | ORAL_TABLET | Freq: Every day | ORAL | Status: DC
  Filled 2022-10-14: qty 1

## 2022-10-14 MED ORDER — CHLORTHALIDONE 25 MG PO TABS
25.0000 mg | ORAL_TABLET | Freq: Every day | ORAL | Status: DC
  Administered 2022-10-15: 25 mg via ORAL
  Filled 2022-10-14: qty 1

## 2022-10-14 MED ORDER — CEFAZOLIN SODIUM-DEXTROSE 2-4 GM/100ML-% IV SOLN
2.0000 g | Freq: Four times a day (QID) | INTRAVENOUS | Status: AC
  Administered 2022-10-14 (×2): 2 g via INTRAVENOUS
  Filled 2022-10-14 (×2): qty 100

## 2022-10-14 MED ORDER — ACETAMINOPHEN 10 MG/ML IV SOLN
1000.0000 mg | Freq: Four times a day (QID) | INTRAVENOUS | Status: DC
  Administered 2022-10-14: 1000 mg via INTRAVENOUS
  Filled 2022-10-14: qty 100

## 2022-10-14 MED ORDER — DEXAMETHASONE SODIUM PHOSPHATE 10 MG/ML IJ SOLN
8.0000 mg | Freq: Once | INTRAMUSCULAR | Status: AC
  Administered 2022-10-14: 8 mg via INTRAVENOUS

## 2022-10-14 MED ORDER — FLUTICASONE PROPIONATE 50 MCG/ACT NA SUSP
2.0000 | Freq: Every day | NASAL | Status: DC
  Administered 2022-10-15: 2 via NASAL
  Filled 2022-10-14: qty 16

## 2022-10-14 MED ORDER — ALBUTEROL SULFATE (2.5 MG/3ML) 0.083% IN NEBU
2.5000 mg | INHALATION_SOLUTION | Freq: Four times a day (QID) | RESPIRATORY_TRACT | Status: DC | PRN
  Administered 2022-10-14: 2.5 mg via RESPIRATORY_TRACT

## 2022-10-14 MED ORDER — MENTHOL 3 MG MT LOZG: 1.0000 | LOZENGE | OROMUCOSAL | Status: DC | PRN

## 2022-10-14 MED ORDER — HYDROMORPHONE HCL 1 MG/ML IJ SOLN
0.5000 mg | INTRAMUSCULAR | Status: DC | PRN
  Administered 2022-10-14 – 2022-10-15 (×2): 1 mg via INTRAVENOUS
  Filled 2022-10-14 (×2): qty 1

## 2022-10-14 MED ORDER — BUPIVACAINE IN DEXTROSE 0.75-8.25 % IT SOLN
INTRATHECAL | Status: DC | PRN
  Administered 2022-10-14: 1.4 mL via INTRATHECAL

## 2022-10-14 MED ORDER — HYDROCODONE-ACETAMINOPHEN 7.5-325 MG PO TABS
1.0000 | ORAL_TABLET | ORAL | Status: DC | PRN
  Administered 2022-10-15 (×2): 2 via ORAL
  Filled 2022-10-14 (×3): qty 2

## 2022-10-14 MED ORDER — METHOCARBAMOL 500 MG IVPB - SIMPLE MED
500.0000 mg | Freq: Four times a day (QID) | INTRAVENOUS | Status: DC | PRN
  Filled 2022-10-14: qty 55

## 2022-10-14 MED ORDER — METOCLOPRAMIDE HCL 5 MG PO TABS: 5.0000 mg | ORAL_TABLET | Freq: Three times a day (TID) | ORAL | Status: DC | PRN

## 2022-10-14 MED ORDER — CLONIDINE HCL (ANALGESIA) 100 MCG/ML EP SOLN
EPIDURAL | Status: DC | PRN
  Administered 2022-10-14: 50 ug

## 2022-10-14 MED ORDER — MIDAZOLAM HCL 2 MG/2ML IJ SOLN
1.0000 mg | INTRAMUSCULAR | Status: DC
  Filled 2022-10-14: qty 2

## 2022-10-14 MED ORDER — PHENYLEPHRINE HCL-NACL 20-0.9 MG/250ML-% IV SOLN
INTRAVENOUS | Status: DC | PRN
  Administered 2022-10-14: 40 ug/min via INTRAVENOUS

## 2022-10-14 MED ORDER — ONDANSETRON HCL 4 MG/2ML IJ SOLN: 4.0000 mg | Freq: Four times a day (QID) | INTRAMUSCULAR | Status: DC | PRN

## 2022-10-14 MED ORDER — AMISULPRIDE (ANTIEMETIC) 5 MG/2ML IV SOLN: 10.0000 mg | Freq: Once | INTRAVENOUS | Status: DC | PRN

## 2022-10-14 MED ORDER — ONDANSETRON HCL 4 MG PO TABS: 4.0000 mg | ORAL_TABLET | Freq: Four times a day (QID) | ORAL | Status: DC | PRN

## 2022-10-14 MED ORDER — TRANEXAMIC ACID-NACL 1000-0.7 MG/100ML-% IV SOLN
1000.0000 mg | INTRAVENOUS | Status: AC
  Administered 2022-10-14: 1000 mg via INTRAVENOUS
  Filled 2022-10-14: qty 100

## 2022-10-14 SURGICAL SUPPLY — 56 items
ATTUNE PSFEM LTSZ5 NARCEM KNEE (Femur) IMPLANT
ATTUNE PSRP INSE SZ5 7 KNEE (Insert) IMPLANT
BAG COUNTER SPONGE SURGICOUNT (BAG) IMPLANT
BAG SPEC THK2 15X12 ZIP CLS (MISCELLANEOUS) ×1
BAG SPNG CNTER NS LX DISP (BAG)
BAG ZIPLOCK 12X15 (MISCELLANEOUS) ×1 IMPLANT
BASEPLATE TIBIAL ROTATING SZ 4 (Knees) IMPLANT
BLADE SAG 18X100X1.27 (BLADE) ×1 IMPLANT
BLADE SAW SGTL 11.0X1.19X90.0M (BLADE) ×1 IMPLANT
BNDG ELASTIC 6X5.8 VLCR STR LF (GAUZE/BANDAGES/DRESSINGS) ×1 IMPLANT
BOWL SMART MIX CTS (DISPOSABLE) ×1 IMPLANT
BSPLAT TIB 4 CMNT ROT PLAT STR (Knees) ×1 IMPLANT
CEMENT HV SMART SET (Cement) ×2 IMPLANT
COVER SURGICAL LIGHT HANDLE (MISCELLANEOUS) ×1 IMPLANT
CUFF TOURN SGL QUICK 34 (TOURNIQUET CUFF) ×1
CUFF TRNQT CYL 34X4.125X (TOURNIQUET CUFF) ×1 IMPLANT
DRAPE INCISE IOBAN 66X45 STRL (DRAPES) ×1 IMPLANT
DRAPE U-SHAPE 47X51 STRL (DRAPES) ×1 IMPLANT
DRSG AQUACEL AG ADV 3.5X10 (GAUZE/BANDAGES/DRESSINGS) ×1 IMPLANT
DURAPREP 26ML APPLICATOR (WOUND CARE) ×1 IMPLANT
ELECT REM PT RETURN 15FT ADLT (MISCELLANEOUS) ×1 IMPLANT
GLOVE BIO SURGEON STRL SZ 6.5 (GLOVE) IMPLANT
GLOVE BIO SURGEON STRL SZ7.5 (GLOVE) IMPLANT
GLOVE BIO SURGEON STRL SZ8 (GLOVE) ×1 IMPLANT
GLOVE BIOGEL PI IND STRL 6.5 (GLOVE) IMPLANT
GLOVE BIOGEL PI IND STRL 7.0 (GLOVE) IMPLANT
GLOVE BIOGEL PI IND STRL 8 (GLOVE) ×1 IMPLANT
GOWN STRL REUS W/ TWL LRG LVL3 (GOWN DISPOSABLE) ×1 IMPLANT
GOWN STRL REUS W/ TWL XL LVL3 (GOWN DISPOSABLE) IMPLANT
GOWN STRL REUS W/TWL LRG LVL3 (GOWN DISPOSABLE) ×1
GOWN STRL REUS W/TWL XL LVL3 (GOWN DISPOSABLE)
HANDPIECE INTERPULSE COAX TIP (DISPOSABLE) ×1
HOLDER FOLEY CATH W/STRAP (MISCELLANEOUS) IMPLANT
IMMOBILIZER KNEE 20 (SOFTGOODS) ×1
IMMOBILIZER KNEE 20 THIGH 36 (SOFTGOODS) ×1 IMPLANT
KIT TURNOVER KIT A (KITS) IMPLANT
MANIFOLD NEPTUNE II (INSTRUMENTS) ×1 IMPLANT
NS IRRIG 1000ML POUR BTL (IV SOLUTION) ×1 IMPLANT
PACK TOTAL KNEE CUSTOM (KITS) ×1 IMPLANT
PADDING CAST ABS COTTON 6X4 NS (CAST SUPPLIES) IMPLANT
PADDING CAST COTTON 6X4 STRL (CAST SUPPLIES) ×2 IMPLANT
PATELLA MEDIAL ATTUN 35MM KNEE (Knees) IMPLANT
PIN STEINMAN FIXATION KNEE (PIN) IMPLANT
PROTECTOR NERVE ULNAR (MISCELLANEOUS) ×1 IMPLANT
SET HNDPC FAN SPRY TIP SCT (DISPOSABLE) ×1 IMPLANT
SPIKE FLUID TRANSFER (MISCELLANEOUS) ×1 IMPLANT
STRIP CLOSURE SKIN 1/2X4 (GAUZE/BANDAGES/DRESSINGS) ×2 IMPLANT
SUT MNCRL AB 4-0 PS2 18 (SUTURE) ×1 IMPLANT
SUT STRATAFIX 0 PDS 27 VIOLET (SUTURE) ×1
SUT VIC AB 2-0 CT1 27 (SUTURE) ×3
SUT VIC AB 2-0 CT1 TAPERPNT 27 (SUTURE) ×3 IMPLANT
SUTURE STRATFX 0 PDS 27 VIOLET (SUTURE) ×1 IMPLANT
TRAY FOLEY MTR SLVR 16FR STAT (SET/KITS/TRAYS/PACK) ×1 IMPLANT
TUBE SUCTION HIGH CAP CLEAR NV (SUCTIONS) ×1 IMPLANT
WATER STERILE IRR 1000ML POUR (IV SOLUTION) ×2 IMPLANT
WRAP KNEE MAXI GEL POST OP (GAUZE/BANDAGES/DRESSINGS) ×1 IMPLANT

## 2022-10-14 NOTE — Anesthesia Postprocedure Evaluation (Signed)
Anesthesia Post Note  Patient: Cheryl Good  Procedure(s) Performed: TOTAL KNEE ARTHROPLASTY (Left: Knee)     Patient location during evaluation: PACU Anesthesia Type: Spinal Level of consciousness: oriented and awake and alert Pain management: pain level controlled Vital Signs Assessment: post-procedure vital signs reviewed and stable Respiratory status: spontaneous breathing, respiratory function stable and patient connected to nasal cannula oxygen Cardiovascular status: blood pressure returned to baseline and stable Postop Assessment: no headache, no backache and no apparent nausea or vomiting Anesthetic complications: no  No notable events documented.  Last Vitals:  Vitals:   10/14/22 1330 10/14/22 1535  BP: (!) 134/57 (!) 121/48  Pulse: (!) 56 (!) 57  Resp: 15 18  Temp:  36.6 C  SpO2: 98% 97%    Last Pain:  Vitals:   10/14/22 1444  TempSrc:   PainSc: 4                  Tiajuana Amass

## 2022-10-14 NOTE — Op Note (Addendum)
OPERATIVE REPORT-TOTAL KNEE ARTHROPLASTY   Pre-operative diagnosis- Osteoarthritis  Left knee(s)  Post-operative diagnosis- Osteoarthritis Left knee(s)  Procedure-  Left  Total Knee Arthroplasty  Surgeon- Dione Plover. Mykah Bellomo, MD  Assistant- Jaynie Bream, PA-C   Anesthesia-   Adductor canal block and spinal  EBL- 25 ml   Drains None  Tourniquet time-  Total Tourniquet Time Documented: Thigh (Left) - 37 minutes Total: Thigh (Left) - 37 minutes     Complications- None  Condition-PACU - hemodynamically stable.   Brief Clinical Note  Cheryl Good is a 76 y.o. year old female with end stage OA of her left knee with progressively worsening pain and dysfunction. She has constant pain, with activity and at rest and significant functional deficits with difficulties even with ADLs. She has had extensive non-op management including analgesics, injections of cortisone and viscosupplements, and home exercise program, but remains in significant pain with significant dysfunction. Radiographs show bone on bone arthritis medial and patellofemoral. She presents now for left Total Knee Arthroplasty.     Procedure in detail---   The patient is brought into the operating room and positioned supine on the operating table. After successful administration of  Adductor canal block and spinal,   a tourniquet is placed high on the  Left thigh(s) and the lower extremity is prepped and draped in the usual sterile fashion. Time out is performed by the operating team and then the  Left lower extremity is wrapped in Esmarch, knee flexed and the tourniquet inflated to 300 mmHg.       A midline incision is made with a ten blade through the subcutaneous tissue to the level of the extensor mechanism. A fresh blade is used to make a medial parapatellar arthrotomy. Soft tissue over the proximal medial tibia is subperiosteally elevated to the joint line with a knife and into the semimembranosus bursa with a Cobb  elevator. Soft tissue over the proximal lateral tibia is elevated with attention being paid to avoiding the patellar tendon on the tibial tubercle. The patella is everted, knee flexed 90 degrees and the ACL and PCL are removed. Findings are bone on bone medial and patellofemoral with massive global osteophytes        The drill is used to create a starting hole in the distal femur and the canal is thoroughly irrigated with sterile saline to remove the fatty contents. The 5 degree Left  valgus alignment guide is placed into the femoral canal and the distal femoral cutting block is pinned to remove 10 mm off the distal femur. Resection is made with an oscillating saw.      The tibia is subluxed forward and the menisci are removed. The extramedullary alignment guide is placed referencing proximally at the medial aspect of the tibial tubercle and distally along the second metatarsal axis and tibial crest. The block is pinned to remove 16m off the more deficient medial  side. Resection is made with an oscillating saw. Size 4is the most appropriate size for the tibia and the proximal tibia is prepared with the modular drill and keel punch for that size.      The femoral sizing guide is placed and size 5 is most appropriate. Rotation is marked off the epicondylar axis and confirmed by creating a rectangular flexion gap at 90 degrees. The size 5 cutting block is pinned in this rotation and the anterior, posterior and chamfer cuts are made with the oscillating saw. The intercondylar block is then placed and that cut  is made.      Trial size 4 tibial component, trial size 5 narrow posterior stabilized femur and a 7  mm posterior stabilized rotating platform insert trial is placed. Full extension is achieved with excellent varus/valgus and anterior/posterior balance throughout full range of motion. The patella is everted and thickness measured to be 22  mm. Free hand resection is taken to 12 mm, a 35 template is placed, lug  holes are drilled, trial patella is placed, and it tracks normally. Osteophytes are removed off the posterior femur with the trial in place. All trials are removed and the cut bone surfaces prepared with pulsatile lavage. Cement is mixed and once ready for implantation, the size 4 tibial implant, size  5 narrow posterior stabilized femoral component, and the size 35 patella are cemented in place and the patella is held with the clamp. The trial insert is placed and the knee held in full extension.  All extruded cement is removed and once the cement is hard the permanent 7 mm posterior stabilized rotating platform insert is placed into the tibial tray.      The wound is copiously irrigated with saline solution and the extensor mechanism closed with # 0 Stratofix suture. The tourniquet is released for a total tourniquet time of 37  minutes. Flexion against gravity is 140 degrees and the patella tracks normally. Subcutaneous tissue is closed with 2.0 vicryl and subcuticular with running 4.0 Monocryl. The incision is cleaned and dried and steri-strips and a bulky sterile dressing are applied. The limb is placed into a knee immobilizer and the patient is awakened and transported to recovery in stable condition.      Please note that a surgical assistant was a medical necessity for this procedure in order to perform it in a safe and expeditious manner. Surgical assistant was necessary to retract the ligaments and vital neurovascular structures to prevent injury to them and also necessary for proper positioning of the limb to allow for anatomic placement of the prosthesis.   Dione Plover Tre Sanker, MD    10/14/2022, 11:42 AM

## 2022-10-14 NOTE — Care Plan (Signed)
Ortho Bundle Case Management Note  Patient Details  Name: MALLERIE BLOK MRN: [id number] Date of Birth: 09/21/1946  L TKA on 10-14-22 DCP:  Home with cousin DME:  No needs, has a RW PT:  [facility] on 10-17-22                   DME Arranged:  N/A DME Agency:  NA  HH Arranged:  NA HH Agency:  NA  Additional Comments: Please contact me with any questions of if this plan should need to change.  Marianne Sofia, RN,CCM [facility]  [phone] 10/14/2022, 4:29 PM

## 2022-10-14 NOTE — Discharge Instructions (Signed)
 Frank Aluisio, MD Total Joint Specialist [facility] [place] [address] [place], [place] [address] [phone]  TOTAL KNEE REPLACEMENT POSTOPERATIVE DIRECTIONS    Knee Rehabilitation, Guidelines Following Surgery  Results after knee surgery are often greatly improved when you follow the exercise, range of motion and muscle strengthening exercises prescribed by your doctor. Safety measures are also important to protect the knee from further injury. If any of these exercises cause you to have increased pain or swelling in your knee joint, decrease the amount until you are comfortable again and slowly increase them. If you have problems or questions, call your caregiver or physical therapist for advice.   BLOOD CLOT PREVENTION Take a 325 mg Aspirin two times a day for three weeks following surgery. Then take an 81 mg Aspirin once a day for three weeks. Then discontinue Aspirin. You may resume your vitamins/supplements upon discharge from the hospital. Do not take any NSAIDs (Advil, Aleve, Ibuprofen, Meloxicam, etc.) until you have discontinued the 325 mg Aspirin.  HOME CARE INSTRUCTIONS  Remove items at home which could result in a fall. This includes throw rugs or furniture in walking pathways.  ICE to the affected knee as much as tolerated. Icing helps control swelling. If the swelling is well controlled you will be more comfortable and rehab easier. Continue to use ice on the knee for pain and swelling from surgery. You may notice swelling that will progress down to the foot and ankle. This is normal after surgery. Elevate the leg when you are not up walking on it.    Continue to use the breathing machine which will help keep your temperature down. It is common for your temperature to cycle up and down following surgery, especially at night when you are not up moving around and exerting yourself. The breathing machine keeps your lungs expanded and your temperature  down. Do not place pillow under the operative knee, focus on keeping the knee straight while resting  DIET You may resume your previous home diet once you are discharged from the hospital.  DRESSING / WOUND CARE / SHOWERING Keep your bulky bandage on for 2 days. On the third post-operative day you may remove the Ace bandage and gauze. There is a waterproof adhesive bandage on your skin which will stay in place until your first follow-up appointment. Once you remove this you will not need to place another bandage You may begin showering 3 days following surgery, but do not submerge the incision under water.  ACTIVITY For the first 5 days, the key is rest and control of pain and swelling Do your home exercises twice a day starting on post-operative day 3. On the days you go to physical therapy, just do the home exercises once that day. You should rest, ice and elevate the leg for 50 minutes out of every hour. Get up and walk/stretch for 10 minutes per hour. After 5 days you can increase your activity slowly as tolerated. Walk with your walker as instructed. Use the walker until you are comfortable transitioning to a cane. Walk with the cane in the opposite hand of the operative leg. You may discontinue the cane once you are comfortable and walking steadily. Avoid periods of inactivity such as sitting longer than an hour when not asleep. This helps prevent blood clots.  You may discontinue the knee immobilizer once you are able to perform a straight leg raise while lying down. You may resume a sexual relationship in one month   or when given the OK by your doctor.  You may return to work once you are cleared by your doctor.  Do not drive a car for 6 weeks or until released by your surgeon.  Do not drive while taking narcotics.  TED HOSE STOCKINGS Wear the elastic stockings on both legs for three weeks following surgery during the day. You may remove them at night for sleeping.  WEIGHT  BEARING Weight bearing as tolerated with assist device (walker, cane, etc) as directed, use it as long as suggested by your surgeon or therapist, typically at least 4-6 weeks.  POSTOPERATIVE CONSTIPATION PROTOCOL Constipation - defined medically as fewer than three stools per week and severe constipation as less than one stool per week.  One of the most common issues patients have following surgery is constipation.  Even if you have a regular bowel pattern at home, your normal regimen is likely to be disrupted due to multiple reasons following surgery.  Combination of anesthesia, postoperative narcotics, change in appetite and fluid intake all can affect your bowels.  In order to avoid complications following surgery, here are some recommendations in order to help you during your recovery period.  Colace (docusate) - Pick up an over-the-counter form of Colace or another stool softener and take twice a day as long as you are requiring postoperative pain medications.  Take with a full glass of water daily.  If you experience loose stools or diarrhea, hold the colace until you stool forms back up. If your symptoms do not get better within 1 week or if they get worse, check with your doctor. Dulcolax (bisacodyl) - Pick up over-the-counter and take as directed by the product packaging as needed to assist with the movement of your bowels.  Take with a full glass of water.  Use this product as needed if not relieved by Colace only.  MiraLax (polyethylene glycol) - Pick up over-the-counter to have on hand. MiraLax is a solution that will increase the amount of water in your bowels to assist with bowel movements.  Take as directed and can mix with a glass of water, juice, soda, coffee, or tea. Take if you go more than two days without a movement. Do not use MiraLax more than once per day. Call your doctor if you are still constipated or irregular after using this medication for 7 days in a row.  If you continue  to have problems with postoperative constipation, please contact the office for further assistance and recommendations.  If you experience "the worst abdominal pain ever" or develop nausea or vomiting, please contact the office immediatly for further recommendations for treatment.  ITCHING If you experience itching with your medications, try taking only a single pain pill, or even half a pain pill at a time.  You can also use Benadryl over the counter for itching or also to help with sleep.   MEDICATIONS See your medication summary on the "After Visit Summary" that the nursing staff will review with you prior to discharge.  You may have some home medications which will be placed on hold until you complete the course of blood thinner medication.  It is important for you to complete the blood thinner medication as prescribed by your surgeon.  Continue your approved medications as instructed at time of discharge.  PRECAUTIONS If you experience chest pain or shortness of breath - call 911 immediately for transfer to the hospital emergency department.  If you develop a fever greater that 101 F,   purulent drainage from wound, increased redness or drainage from wound, foul odor from the wound/dressing, or calf pain - CONTACT YOUR SURGEON.                                                   FOLLOW-UP APPOINTMENTS Make sure you keep all of your appointments after your operation with your surgeon and caregivers. You should call the office at the above phone number and make an appointment for approximately two weeks after the date of your surgery or on the date instructed by your surgeon outlined in the "After Visit Summary".  RANGE OF MOTION AND STRENGTHENING EXERCISES  Rehabilitation of the knee is important following a knee injury or an operation. After just a few days of immobilization, the muscles of the thigh which control the knee become weakened and shrink (atrophy). Knee exercises are designed to build up  the tone and strength of the thigh muscles and to improve knee motion. Often times heat used for twenty to thirty minutes before working out will loosen up your tissues and help with improving the range of motion but do not use heat for the first two weeks following surgery. These exercises can be done on a training (exercise) mat, on the floor, on a table or on a bed. Use what ever works the best and is most comfortable for you Knee exercises include:  Leg Lifts - While your knee is still immobilized in a splint or cast, you can do straight leg raises. Lift the leg to 60 degrees, hold for 3 sec, and slowly lower the leg. Repeat 10-20 times 2-3 times daily. Perform this exercise against resistance later as your knee gets better.  Quad and Hamstring Sets - Tighten up the muscle on the front of the thigh (Quad) and hold for 5-10 sec. Repeat this 10-20 times hourly. Hamstring sets are done by pushing the foot backward against an object and holding for 5-10 sec. Repeat as with quad sets.  Leg Slides: Lying on your back, slowly slide your foot toward your buttocks, bending your knee up off the floor (only go as far as is comfortable). Then slowly slide your foot back down until your leg is flat on the floor again. Angel Wings: Lying on your back spread your legs to the side as far apart as you can without causing discomfort.  A rehabilitation program following serious knee injuries can speed recovery and prevent re-injury in the future due to weakened muscles. Contact your doctor or a physical therapist for more information on knee rehabilitation.   POST-OPERATIVE OPIOID TAPER INSTRUCTIONS: It is important to wean off of your opioid medication as soon as possible. If you do not need pain medication after your surgery it is ok to stop day one. Opioids include: Codeine, Hydrocodone(Norco, Vicodin), Oxycodone(Percocet, oxycontin) and hydromorphone amongst others.  Long term and even short term use of opiods can  cause: Increased pain response Dependence Constipation Depression Respiratory depression And more.  Withdrawal symptoms can include Flu like symptoms Nausea, vomiting And more Techniques to manage these symptoms Hydrate well Eat regular healthy meals Stay active Use relaxation techniques(deep breathing, meditating, yoga) Do Not substitute Alcohol to help with tapering If you have been on opioids for less than two weeks and do not have pain than it is ok to stop all together.  Plan   to wean off of opioids This plan should start within one week post op of your joint replacement. Maintain the same interval or time between taking each dose and first decrease the dose.  Cut the total daily intake of opioids by one tablet each day Next start to increase the time between doses. The last dose that should be eliminated is the evening dose.   IF YOU ARE TRANSFERRED TO A SKILLED REHAB FACILITY If the patient is transferred to a skilled rehab facility following release from the hospital, a list of the current medications will be sent to the facility for the patient to continue.  When discharged from the skilled rehab facility, please have the facility set up the patient's [place] prior to being released. Also, the skilled facility will be responsible for providing the patient with their medications at time of release from the facility to include their pain medication, the muscle relaxants, and their blood thinner medication. If the patient is still at the rehab facility at time of the two week follow up appointment, the skilled rehab facility will also need to assist the patient in arranging follow up appointment in our office and any transportation needs.  MAKE SURE YOU:  Understand these instructions.  Get help right away if you are not doing well or get worse.   DENTAL ANTIBIOTICS:  In most cases prophylactic antibiotics for Dental procdeures after total joint surgery are  not necessary.  Exceptions are as follows:  1. History of prior total joint infection  2. Severely immunocompromised (Organ Transplant, cancer chemotherapy, Rheumatoid biologic medications such as [place])  3. Poorly controlled diabetes (A1C &gt; 8.0, blood glucose over 200)  If you have one of these conditions, contact your surgeon for an antibiotic prescription, prior to your dental procedure.    Pick up stool softner and laxative for home use following surgery while on pain medications. Do not submerge incision under water. Please use good hand washing techniques while changing dressing each day. May shower starting three days after surgery. Please use a clean towel to pat the incision dry following showers. Continue to use ice for pain and swelling after surgery. Do not use any lotions or creams on the incision until instructed by your surgeon.
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
## 2022-10-14 NOTE — Anesthesia Procedure Notes (Signed)
Spinal  Patient location during procedure: OR Start time: 10/14/2022 10:40 AM End time: 10/14/2022 10:44 AM Reason for block: surgical anesthesia Staffing Performed: anesthesiologist  Anesthesiologist: Suzette Battiest, MD Performed by: Suzette Battiest, MD Authorized by: Suzette Battiest, MD   Preanesthetic Checklist Completed: patient identified, IV checked, site marked, risks and benefits discussed, surgical consent, monitors and equipment checked, pre-op evaluation and timeout performed Spinal Block Patient position: sitting Prep: DuraPrep Patient monitoring: heart rate, cardiac monitor, continuous pulse ox and blood pressure Approach: midline Location: L4-5 Injection technique: single-shot Needle Needle type: Pencan  Needle gauge: 24 G Needle length: 9 cm Assessment Sensory level: T4 Events: CSF return

## 2022-10-14 NOTE — Anesthesia Preprocedure Evaluation (Addendum)
Anesthesia Evaluation  Patient identified by MRN, date of birth, ID band Patient awake    Reviewed: Allergy & Precautions, NPO status , Patient's Chart, lab work & pertinent test results  History of Anesthesia Complications (+) history of anesthetic complications  Airway Mallampati: II  TM Distance: >3 FB Neck ROM: Full    Dental  (+) Dental Advisory Given   Pulmonary neg pulmonary ROS   breath sounds clear to auscultation       Cardiovascular hypertension, Pt. on medications  Rhythm:Regular Rate:Normal     Neuro/Psych negative neurological ROS     GI/Hepatic negative GI ROS, Neg liver ROS,,,  Endo/Other  diabetes, Type 2, Oral Hypoglycemic Agents    Renal/GU negative Renal ROS     Musculoskeletal  (+) Arthritis ,    Abdominal   Peds  Hematology negative hematology ROS (+)   Anesthesia Other Findings   Reproductive/Obstetrics                              Lab Results  Component Value Date   WBC 6.7 10/04/2022   HGB 10.7 (L) 10/04/2022   HCT 35.5 (L) 10/04/2022   MCV 82.8 10/04/2022   PLT 329 10/04/2022   Lab Results  Component Value Date   CREATININE 1.27 (H) 10/04/2022   BUN 23 10/04/2022   NA 143 10/04/2022   K 3.4 (L) 10/04/2022   CL 106 10/04/2022   CO2 30 10/04/2022    Anesthesia Physical Anesthesia Plan  ASA: 2  Anesthesia Plan: Spinal and MAC   Post-op Pain Management: Regional block* and Ofirmev IV (intra-op)*   Induction:   PONV Risk Score and Plan: 2 and Propofol infusion, Ondansetron and Treatment may vary due to age or medical condition  Airway Management Planned: Natural Airway and Simple Face Mask  Additional Equipment: None  Intra-op Plan:   Post-operative Plan:   Informed Consent: I have reviewed the patients History and Physical, chart, labs and discussed the procedure including the risks, benefits and alternatives for the proposed anesthesia  with the patient or authorized representative who has indicated his/her understanding and acceptance.       Plan Discussed with: CRNA  Anesthesia Plan Comments:          Anesthesia Quick Evaluation

## 2022-10-14 NOTE — Progress Notes (Signed)
Orthopedic Tech Progress Note Patient Details:  JENNISE BOTH 04/24/1946 [id number]  CPM Left Knee CPM Left Knee: On Left Knee Flexion (Degrees): 40 Left Knee Extension (Degrees): 10  Post Interventions Patient Tolerated: Well  Linus Salmons Cleto Claggett 10/14/2022, 12:56 PM

## 2022-10-14 NOTE — Anesthesia Procedure Notes (Signed)
Anesthesia Regional Block: Adductor canal block   Pre-Anesthetic Checklist: , timeout performed,  Correct Patient, Correct Site, Correct Laterality,  Correct Procedure, Correct Position, site marked,  Risks and benefits discussed,  Surgical consent,  Pre-op evaluation,  At surgeon's request and post-op pain management  Laterality: Left  Prep: chloraprep       Needles:  Injection technique: Single-shot  Needle Type: Echogenic Needle     Needle Length: 9cm  Needle Gauge: 21     Additional Needles:   Procedures:,,,, ultrasound used (permanent image in chart),,    Narrative:  Start time: 10/14/2022 9:40 AM End time: 10/14/2022 9:45 AM Injection made incrementally with aspirations every 5 mL.  Performed by: Personally  Anesthesiologist: Suzette Battiest, MD

## 2022-10-14 NOTE — Interval H&P Note (Signed)
History and Physical Interval Note:  10/14/2022 8:31 AM  Cheryl Good  has presented today for surgery, with the diagnosis of left knee osteoarthritis.  The various methods of treatment have been discussed with the patient and family. After consideration of risks, benefits and other options for treatment, the patient has consented to  Procedure(s): TOTAL KNEE ARTHROPLASTY (Left) as a surgical intervention.  The patient's history has been reviewed, patient examined, no change in status, stable for surgery.  I have reviewed the patient's chart and labs.  Questions were answered to the patient's satisfaction.     Pilar Plate Germaine Shenker

## 2022-10-14 NOTE — Transfer of Care (Signed)
Immediate Anesthesia Transfer of Care Note  Patient: Cheryl Good  Procedure(s) Performed: TOTAL KNEE ARTHROPLASTY (Left: Knee)  Patient Location: PACU  Anesthesia Type:Spinal  Level of Consciousness: drowsy and patient cooperative  Airway & Oxygen Therapy: Patient Spontanous Breathing and Patient connected to face mask oxygen  Post-op Assessment: Report given to RN and Post -op Vital signs reviewed and stable  Post vital signs: Reviewed and stable  Last Vitals:  Vitals Value Taken Time  BP 114/47 10/14/22 1204  Temp    Pulse 65 10/14/22 1208  Resp 15 10/14/22 1208  SpO2 100 % 10/14/22 1208  Vitals shown include unvalidated device data.  Last Pain:  Vitals:   10/14/22 1015  TempSrc:   PainSc: 0-No pain         Complications: No notable events documented.

## 2022-10-15 ENCOUNTER — Encounter (HOSPITAL_COMMUNITY): Payer: Self-pay | Admitting: Orthopedic Surgery

## 2022-10-15 DIAGNOSIS — I10 Essential (primary) hypertension: Secondary | ICD-10-CM | POA: Diagnosis not present

## 2022-10-15 DIAGNOSIS — Z7984 Long term (current) use of oral hypoglycemic drugs: Secondary | ICD-10-CM | POA: Diagnosis not present

## 2022-10-15 DIAGNOSIS — Z7951 Long term (current) use of inhaled steroids: Secondary | ICD-10-CM | POA: Diagnosis not present

## 2022-10-15 DIAGNOSIS — M1712 Unilateral primary osteoarthritis, left knee: Secondary | ICD-10-CM | POA: Diagnosis not present

## 2022-10-15 DIAGNOSIS — E119 Type 2 diabetes mellitus without complications: Secondary | ICD-10-CM | POA: Diagnosis not present

## 2022-10-15 DIAGNOSIS — Z96651 Presence of right artificial knee joint: Secondary | ICD-10-CM | POA: Diagnosis not present

## 2022-10-15 DIAGNOSIS — Z79899 Other long term (current) drug therapy: Secondary | ICD-10-CM | POA: Diagnosis not present

## 2022-10-15 LAB — BASIC METABOLIC PANEL
Anion gap: 9 (ref 5–15)
BUN: 19 mg/dL (ref 8–23)
CO2: 26 mmol/L (ref 22–32)
Calcium: 8.3 mg/dL — ABNORMAL LOW (ref 8.9–10.3)
Chloride: 103 mmol/L (ref 98–111)
Creatinine, Ser: 1.27 mg/dL — ABNORMAL HIGH (ref 0.44–1.00)
GFR, Estimated: 44 mL/min — ABNORMAL LOW (ref >60)
Glucose, Bld: 164 mg/dL — ABNORMAL HIGH (ref 70–99)
Potassium: 3.6 mmol/L (ref 3.5–5.1)
Sodium: 138 mmol/L (ref 135–145)

## 2022-10-15 LAB — CBC
HCT: 31.8 % — ABNORMAL LOW (ref 36.0–46.0)
Hemoglobin: 9.6 g/dL — ABNORMAL LOW (ref 12.0–15.0)
MCH: 24.8 pg — ABNORMAL LOW (ref 26.0–34.0)
MCHC: 30.2 g/dL (ref 30.0–36.0)
MCV: 82.2 fL (ref 80.0–100.0)
Platelets: 320 10*3/uL (ref 150–400)
RBC: 3.87 MIL/uL (ref 3.87–5.11)
RDW: 16.5 % — ABNORMAL HIGH (ref 11.5–15.5)
WBC: 8.9 10*3/uL (ref 4.0–10.5)
nRBC: 0 % (ref 0.0–0.2)

## 2022-10-15 LAB — GLUCOSE, CAPILLARY
Glucose-Capillary: 176 mg/dL — ABNORMAL HIGH (ref 70–99)
Glucose-Capillary: 124 mg/dL — ABNORMAL HIGH (ref 70–99)

## 2022-10-15 MED ORDER — HYDROCODONE-ACETAMINOPHEN 5-325 MG PO TABS: 1.0000 | ORAL_TABLET | Freq: Four times a day (QID) | ORAL | 0 refills | Status: DC | PRN

## 2022-10-15 MED ORDER — METHOCARBAMOL 500 MG PO TABS: 500.0000 mg | ORAL_TABLET | Freq: Four times a day (QID) | ORAL | 0 refills | Status: AC | PRN

## 2022-10-15 MED ORDER — ASPIRIN 325 MG PO TBEC: 325.0000 mg | DELAYED_RELEASE_TABLET | Freq: Two times a day (BID) | ORAL | 0 refills | Status: AC

## 2022-10-15 NOTE — Plan of Care (Signed)
Patient discharging home via private vehicle with husband. Ivan Anchors, RN 10/15/22 2:33 PM

## 2022-10-15 NOTE — TOC Transition Note (Signed)
Transition of Care [facility]) - CM/SW Discharge Note   Patient Details  Name: Cheryl Good MRN: [id number] Date of Birth: 12/20/1945  Transition of Care [facility]) CM/SW Contact:  Lennart Pall, LCSW Phone Number: 10/15/2022, 9:37 AM   Clinical Narrative:     Met briefly with pt and confirming she has needed DME at home.  OPPT arranged with [facility].  No TOC needs.  Final next level of care: OP Rehab Barriers to Discharge: No Barriers Identified   Patient Goals and CMS Choice Patient states their goals for this hospitalization and ongoing recovery are:: return home      Discharge Placement                       Discharge Plan and Services                DME Arranged: N/A DME Agency: NA       HH Arranged: NA HH Agency: NA        Social Determinants of Health (SDOH) Interventions     Readmission Risk Interventions     No data to display

## 2022-10-15 NOTE — Progress Notes (Signed)
Physical Therapy Treatment Patient Details Name: Cheryl Good MRN: [id number] DOB: [birth year] Today's Date: 10/15/2022   History of Present Illness Pt is a 76yo female presenting s/p L-TKA on 10/14/22. PMH: DM, HTN, R-TKA 2017.    PT Comments    Pt tolerates heel slides and LAQ while seated EOB, able to activate quad and flex knee without increased pain. Pt performs STS reps from bed and recliner with RW, BUE assisting to power to stand and LLE slightly extended for comfort. Pt ambulates ~50 ft with RW, step to pattern, good steadiness without overt LOB. Pt able to ascend/descends steps with RW, stepping up backwards with RW and stepping forwards to descend with RW; pt completes 1 step, 1 step, 2 steps and 2 steps with standing rest break between, min guard for safety. Plan to complete stair training with pt's spouse in afternoon session with hopes to d/c home with good family support.   Recommendations for follow up therapy are one component of a multi-disciplinary discharge planning process, led by the attending physician.  Recommendations may be updated based on patient status, additional functional criteria and insurance authorization.  Follow Up Recommendations  Follow physician's recommendations for discharge plan and follow up therapies     Assistance Recommended at Discharge Frequent or constant Supervision/Assistance  Patient can return home with the following A little help with walking and/or transfers;A little help with bathing/dressing/bathroom;Assistance with cooking/housework;Assist for transportation;Help with stairs or ramp for entrance   Equipment Recommendations  None recommended by PT    Recommendations for Other Services       Precautions / Restrictions Precautions Precautions: Fall;Knee Precaution Booklet Issued: No Precaution Comments: no pillow under the knee Restrictions Weight Bearing Restrictions: No LLE Weight Bearing: Weight bearing as  tolerated Other Position/Activity Restrictions: wbat     Mobility  Bed Mobility Overal bed mobility: Needs Assistance Bed Mobility: Supine to Sit  Supine to sit: Min guard General bed mobility comments: pt able to self assist LLE to EOB with increased time    Transfers Overall transfer level: Needs assistance Equipment used: Rolling walker (2 wheels) Transfers: Sit to/from Stand Sit to Stand: Min guard  General transfer comment: BUE assisting to power to stand    Ambulation/Gait Ambulation/Gait assistance: Min guard, +2 safety/equipment Gait Distance (Feet): 50 Feet Assistive device: Rolling walker (2 wheels) Gait Pattern/deviations: Step-to pattern, Decreased stride length, Decreased stance time - left, Decreased weight shift to left Gait velocity: decreased  General Gait Details: step to pattern on LLE, decreased WB and stance time, no knee buckling or overt LOB   Stairs Stairs: Yes Stairs assistance: Min guard Stair Management: With walker Number of Stairs: 6 General stair comments: pt using RW for step to backward up stairs and step to forward with RW down stairs, min guard for safety, no overt LOB, good sequencing   Wheelchair Mobility    Modified Rankin (Stroke Patients Only)       Balance Overall balance assessment: Needs assistance Sitting-balance support: Feet supported, No upper extremity supported Sitting balance-Leahy Scale: Good  Standing balance support: Reliant on assistive device for balance, During functional activity, Bilateral upper extremity supported Standing balance-Leahy Scale: Poor     Cognition Arousal/Alertness: Awake/alert Behavior During Therapy: WFL for tasks assessed/performed Overall Cognitive Status: Within Functional Limits for tasks assessed     Exercises Total Joint Exercises Heel Slides: Seated, AROM, Left, 5 reps Long Arc Quad: Seated, AROM, Left, 5 reps    General Comments  Pertinent Vitals/Pain Pain  Assessment Pain Assessment: 0-10 Pain Score: 3  Pain Location: L knee Pain Descriptors / Indicators: Sore Pain Intervention(s): Limited activity within patient's tolerance, Monitored during session, Premedicated before session, Repositioned, Ice applied    Home Living                          Prior Function            PT Goals (current goals can now be found in the care plan section) Acute Rehab PT Goals Patient Stated Goal: To return to work PT Goal Formulation: With patient Time For Goal Achievement: 10/21/22 Potential to Achieve Goals: Good Progress towards PT goals: Progressing toward goals    Frequency    7X/week      PT Plan      Co-evaluation              AM-PAC PT "6 Clicks" Mobility   Outcome Measure  Help needed turning from your back to your side while in a flat bed without using bedrails?: None Help needed moving from lying on your back to sitting on the side of a flat bed without using bedrails?: A Little Help needed moving to and from a bed to a chair (including a wheelchair)?: A Little Help needed standing up from a chair using your arms (e.g., wheelchair or bedside chair)?: A Little Help needed to walk in hospital room?: A Little Help needed climbing 3-5 steps with a railing? : A Little 6 Click Score: 19    End of Session Equipment Utilized During Treatment: Gait belt Activity Tolerance: Patient tolerated treatment well Patient left: in chair;with call bell/phone within reach;with chair alarm set Nurse Communication: Mobility status PT Visit Diagnosis: Pain;Difficulty in walking, not elsewhere classified (R26.2) Pain - Right/Left: Left Pain - part of body: Knee     Time: 6659-9357 PT Time Calculation (min) (ACUTE ONLY): 36 min  Charges:  $Gait Training: 8-22 mins $Therapeutic Exercise: 8-22 mins                      Tori Yuvraj Pfeifer PT, DPT 10/15/22, 10:21 AM

## 2022-10-15 NOTE — Progress Notes (Addendum)
   Subjective: 1 Day Post-Op Procedure(s) (LRB): TOTAL KNEE ARTHROPLASTY (Left) Patient reports pain as mild.   Patient seen in rounds by Dr. Wynelle Link. Patient is well, and has had no acute complaints or problems other than pain in the left knee. No acute events overnight. Denies chest pain or SOB. Foley catheter removed this AM. We will continue therapy today, ambulated 31' yesterday.  Objective: Vital signs in last 24 hours: Temp:  [97.4 F (36.3 C)-98.7 F (37.1 C)] 97.6 F (36.4 C) (11/28 0435) Pulse Rate:  [44-67] 44 (11/28 0435) Resp:  [13-23] 14 (11/28 0435) BP: (109-143)/(39-69) 111/45 (11/28 0435) SpO2:  [94 %-100 %] 97 % (11/28 0435) Weight:  [85 kg] 85 kg (11/27 0851)  Intake/Output from previous day:  Intake/Output Summary (Last 24 hours) at 10/15/2022 0726 Last data filed at 10/15/2022 0600 Gross per 24 hour  Intake 3494.12 ml  Output 1350 ml  Net 2144.12 ml     Intake/Output this shift: No intake/output data recorded.  Labs: Recent Labs    10/15/22 0317  HGB 9.6*   Recent Labs    10/15/22 0317  WBC 8.9  RBC 3.87  HCT 31.8*  PLT 320   Recent Labs    10/15/22 0317  NA 138  K 3.6  CL 103  CO2 26  BUN 19  CREATININE 1.27*  GLUCOSE 164*  CALCIUM 8.3*    Exam: General - Patient is Alert and Oriented Extremity - Neurologically intact Neurovascular intact Sensation intact distally Dorsiflexion/Plantar flexion intact Dressing - dressing C/D/I Motor Function - intact, moving foot and toes well on exam.   Past Medical History:  Diagnosis Date   Allergy    Arthritis    Chicken pox    Complication of anesthesia    laryngospasms with amino esters; sensitive to anesthesia   Diabetes mellitus    Heart murmur    Hypertension    Measles    Mumps    Rubella    Trichomonas    Yeast infection     Assessment/Plan: 1 Day Post-Op Procedure(s) (LRB): TOTAL KNEE ARTHROPLASTY (Left) Principal Problem:   OA (osteoarthritis) of knee Active  Problems:   Osteoarthritis of left knee  Estimated body mass index is 35.41 kg/m as calculated from the following:   Height as of this encounter: '5\' 1"'$  (1.549 m).   Weight as of this encounter: 85 kg. Advance diet Up with therapy D/C IV fluids   Patient's anticipated LOS is less than 2 midnights, meeting these requirements: - Lives within 1 hour of care - Has a competent adult at home to recover with post-op recover - NO history of  - Chronic pain requiring opioids  - Coronary Artery Disease  - Heart failure  - Heart attack  - Stroke  - DVT/VTE  - Cardiac arrhythmia  - Respiratory Failure/COPD  - Renal failure  - Anemia  - Advanced Liver disease  DVT Prophylaxis - Aspirin Weight bearing as tolerated. Continue therapy.  Kidney markers stable this AM with labs.  Plan is to go Home after hospital stay. Plan for discharge later today if progresses with therapy and pain controlled. [facility]. Follow-up in the office in 2 weeks.  The PDMP database was reviewed today prior to any opioid medications being prescribed to this patient.  Theresa Duty, PA-C Orthopedic Surgery [phone] 10/15/2022, 7:26 AM

## 2022-10-15 NOTE — Progress Notes (Signed)
Physical Therapy Treatment Patient Details Name: Cheryl Good MRN: [id number] DOB: 13-Dec-1945 Today's Date: 10/15/2022   History of Present Illness Pt is a 76yo female presenting s/p L-TKA on 10/14/22. PMH: DM, HTN, R-TKA 2017.    PT Comments    Pt with spouse in room, ready to complete stair training with spouse present. Pt ambulates with RW and supv, 1 seated rest break due to pain in L knee. Pt and spouse educated on stair sequencing, able to perform with min guard, all questions answered and pt declines further reps. Once returning to room, provided written/illustrated HEP, able to perform ankle pumps and quad sets appropriately, reviewed exercises on paper and all questions answered. Pt with good family support, spouse able to assist as needed and present for session with all questions answered.      Recommendations for follow up therapy are one component of a multi-disciplinary discharge planning process, led by the attending physician.  Recommendations may be updated based on patient status, additional functional criteria and insurance authorization.  Follow Up Recommendations  Follow physician's recommendations for discharge plan and follow up therapies     Assistance Recommended at Discharge Frequent or constant Supervision/Assistance  Patient can return home with the following A little help with walking and/or transfers;A little help with bathing/dressing/bathroom;Assistance with cooking/housework;Assist for transportation;Help with stairs or ramp for entrance   Equipment Recommendations  None recommended by PT    Recommendations for Other Services       Precautions / Restrictions Precautions Precautions: Fall;Knee Precaution Booklet Issued: No Precaution Comments: no pillow under the knee Restrictions Weight Bearing Restrictions: No LLE Weight Bearing: Weight bearing as tolerated     Mobility  Bed Mobility Overal bed mobility: Needs Assistance Bed Mobility:  Supine to Sit  Supine to sit: Min guard  General bed mobility comments: in recliner    Transfers Overall transfer level: Needs assistance Equipment used: Rolling walker (2 wheels) Transfers: Sit to/from Stand Sit to Stand: Supervision  General transfer comment: BUE assisting to power to stand, good steadiness    Ambulation/Gait Ambulation/Gait assistance: Supervision Gait Distance (Feet): 130 Feet (+40 ft) Assistive device: Rolling walker (2 wheels) Gait Pattern/deviations: Step-to pattern, Decreased stride length, Decreased stance time - left, Decreased weight shift to left Gait velocity: decreased  General Gait Details: step to pattern on LLE, decreased WB and stance time, no knee buckling or LOB, seated rest break between ambulation bouts   Stairs Stairs: Yes Stairs assistance: Min guard Stair Management: With walker Number of Stairs: 4 General stair comments: pt ascends/descends with RW, ascends backwards with RW and descends forward with RW, min guard for safety, spouse present for stair training   Wheelchair Mobility    Modified Rankin (Stroke Patients Only)       Balance Overall balance assessment: Needs assistance Sitting-balance support: Feet supported, No upper extremity supported Sitting balance-Leahy Scale: Good  Standing balance support: Reliant on assistive device for balance, During functional activity, Bilateral upper extremity supported Standing balance-Leahy Scale: Poor      Cognition Arousal/Alertness: Awake/alert Behavior During Therapy: WFL for tasks assessed/performed Overall Cognitive Status: Within Functional Limits for tasks assessed     Exercises Total Joint Exercises Ankle Circles/Pumps: Seated, AROM, Both, 5 reps Quad Sets: Seated, AROM, Strengthening, Both, 5 reps Heel Slides: Seated, AROM, Left, 5 reps Long Arc Quad: Seated, AROM, Left, 5 reps    General Comments        Pertinent Vitals/Pain Pain Assessment Pain Assessment:  0-10 Pain Score: 3  Pain Location: L knee Pain Descriptors / Indicators: Sore Pain Intervention(s): Limited activity within patient's tolerance, Monitored during session, Premedicated before session, Repositioned, Ice applied    Home Living                          Prior Function            PT Goals (current goals can now be found in the care plan section) Acute Rehab PT Goals Patient Stated Goal: To return to work PT Goal Formulation: With patient Time For Goal Achievement: 10/21/22 Potential to Achieve Goals: Good Progress towards PT goals: Progressing toward goals    Frequency    7X/week      PT Plan Current plan remains appropriate    Co-evaluation              AM-PAC PT "6 Clicks" Mobility   Outcome Measure  Help needed turning from your back to your side while in a flat bed without using bedrails?: None Help needed moving from lying on your back to sitting on the side of a flat bed without using bedrails?: A Little Help needed moving to and from a bed to a chair (including a wheelchair)?: A Little Help needed standing up from a chair using your arms (e.g., wheelchair or bedside chair)?: A Little Help needed to walk in hospital room?: A Little Help needed climbing 3-5 steps with a railing? : A Little 6 Click Score: 19    End of Session Equipment Utilized During Treatment: Gait belt Activity Tolerance: Patient tolerated treatment well Patient left: in chair;with call bell/phone within reach;with chair alarm set;with family/visitor present Nurse Communication: Mobility status PT Visit Diagnosis: Pain;Difficulty in walking, not elsewhere classified (R26.2) Pain - Right/Left: Left Pain - part of body: Knee     Time: 6759-1638 PT Time Calculation (min) (ACUTE ONLY): 32 min  Charges:  $Gait Training: 8-22 mins $Therapeutic Exercise: 8-22 mins                      Tori Meredyth Hornung PT, DPT 10/15/22, 2:20 PM

## 2022-10-15 NOTE — Plan of Care (Signed)
Problem: Coping: Goal: Ability to adjust to condition or change in health will improve Outcome: Progressing   Problem: Clinical Measurements: Goal: Ability to maintain clinical measurements within normal limits will improve Outcome: Progressing   Problem: Activity: Goal: Risk for activity intolerance will decrease Outcome: Progressing   Problem: Pain Managment: Goal: General experience of comfort will improve Outcome: [place], RN 10/15/22 8:28 AM

## 2022-10-16 NOTE — Discharge Summary (Signed)
Patient ID: SENNA LAPE MRN: [id number] DOB/AGE: 76-Aug-1947 76 y.o y.o.  Admit date: 10/14/2022 Discharge date: 10/15/2022  Admission Diagnoses:  Principal Problem:   OA (osteoarthritis) of knee Active Problems:   Osteoarthritis of left knee   Discharge Diagnoses:  Same  Past Medical History:  Diagnosis Date   Allergy    Arthritis    Chicken pox    Complication of anesthesia    laryngospasms with amino esters; sensitive to anesthesia   Diabetes mellitus    Heart murmur    Hypertension    Measles    Mumps    Rubella    Trichomonas    Yeast infection     Surgeries: Procedure(s): TOTAL KNEE ARTHROPLASTY on 10/14/2022   Consultants:   Discharged Condition: Improved  Hospital Course: EMBERLI BALLESTER is an 76 y.o. female who was admitted 10/14/2022 for operative treatment ofOA (osteoarthritis) of knee. Patient has severe unremitting pain that affects sleep, daily activities, and work/hobbies. After pre-op clearance the patient was taken to the operating room on 10/14/2022 and underwent  Procedure(s): TOTAL KNEE ARTHROPLASTY.    Patient was given perioperative antibiotics:  Anti-infectives (From admission, onward)    Start     Dose/Rate Route Frequency Ordered Stop   10/14/22 1800  ceFAZolin (ANCEF) IVPB 2g/100 mL premix        2 g 200 mL/hr over 30 Minutes Intravenous Every 6 hours 10/14/22 1352 10/14/22 2356   10/14/22 0830  ceFAZolin (ANCEF) IVPB 2g/100 mL premix        2 g 200 mL/hr over 30 Minutes Intravenous On call to O.R. 10/14/22 0816 10/14/22 1100        Patient was given sequential compression devices, early ambulation, and chemoprophylaxis to prevent DVT.  Patient benefited maximally from hospital stay and there were no complications.    Recent vital signs: No data found.   Recent laboratory studies:  Recent Labs    10/15/22 0317  WBC 8.9  HGB 9.6*  HCT 31.8*  PLT 320  NA 138  K 3.6  CL 103  CO2 26  BUN 19  CREATININE 1.27*   GLUCOSE 164*  CALCIUM 8.3*     Discharge Medications:   Allergies as of 10/15/2022       Reactions   Betadine [povidone Iodine] Anaphylaxis   Bupivacaine Anaphylaxis   Chloroprocaine Anaphylaxis   Etidocaine Anaphylaxis   Lidocaine Anaphylaxis   Pt states that she can tolerate lidocaine   Mepivacaine Anaphylaxis   Other Anaphylaxis, Shortness Of Breath   Anesthetics AMINO ESTERS "caines"   Prilocaine Anaphylaxis   Procaine Anaphylaxis   Tetracaine Anaphylaxis   Gadolinium Derivatives    Other reaction(s): Respiratory distress, Respiratory Distress (ALLERGY/intolerance)   Codeine Nausea And Vomiting   Demerol Other (See Comments)   Hallucinations        Medication List     STOP taking these medications    celecoxib 200 MG capsule Commonly known as: CELEBREX       TAKE these medications    albuterol 108 (90 Base) MCG/ACT inhaler Commonly known as: VENTOLIN HFA Inhale 1-2 puffs into the lungs every 6 (six) hours as needed for wheezing or shortness of breath.   amLODipine 5 MG tablet Commonly known as: NORVASC Take 1 tablet (5 mg total) by mouth daily.   aspirin EC 325 MG tablet Take 1 tablet (325 mg total) by mouth 2 (two) times daily for 20 days. Then take one 81 mg aspirin once a day for three weeks.  Then discontinue aspirin.   CALCIUM CARBONATE PO Take 1 capsule by mouth daily.   chlorthalidone 25 MG tablet Commonly known as: HYGROTON Take 25 mg by mouth daily.   Entresto 24-26 MG Generic drug: sacubitril-valsartan Take 1 tablet by mouth 2 (two) times daily.   EPINEPHrine 0.3 mg/0.3 mL Soaj injection Commonly known as: EPI-PEN Inject 0.3 mg into the muscle as needed for anaphylaxis.   estradiol 0.1 MG/GM vaginal cream Commonly known as: ESTRACE Insert 0.5 grams 3 times per week.   Farxiga 10 MG Tabs tablet Generic drug: dapagliflozin propanediol Take 10 mg by mouth daily.   fexofenadine 180 MG tablet Commonly known as: ALLEGRA Take 180  mg by mouth daily.   fluticasone 50 MCG/ACT nasal spray Commonly known as: FLONASE Place 2 sprays into both nostrils daily.   glimepiride 2 MG tablet Commonly known as: AMARYL Take 1 mg by mouth daily with breakfast.   HYDROcodone-acetaminophen 5-325 MG tablet Commonly known as: NORCO/VICODIN Take 1-2 tablets by mouth every 6 (six) hours as needed for moderate pain or severe pain.   metFORMIN 500 MG tablet Commonly known as: GLUCOPHAGE Take 500 mg by mouth 2 (two) times daily with a meal.   methocarbamol 500 MG tablet Commonly known as: ROBAXIN Take 1 tablet (500 mg total) by mouth every 6 (six) hours as needed for muscle spasms. What changed: when to take this   montelukast 10 MG tablet Commonly known as: SINGULAIR Take 10 mg by mouth daily.   OneTouch Verio test strip Generic drug: glucose blood   Ozempic (2 MG/DOSE) 8 MG/3ML Sopn Generic drug: Semaglutide (2 MG/DOSE) Inject 2 mg into the skin every Saturday.   Pataday 0.1 % ophthalmic solution Generic drug: olopatadine Place 1 drop into both eyes daily.   rosuvastatin 5 MG tablet Commonly known as: CRESTOR Take 1 tablet (5 mg total) by mouth daily.   UNABLE TO FIND Allergy vaccines, takes one injection weekly and dosages change when she changes vial   VITAMIN D3 PO Take 1 tablet by mouth daily.               Discharge Care Instructions  (From admission, onward)           Start     Ordered   10/15/22 0000  Weight bearing as tolerated        10/15/22 0730   10/15/22 0000  Change dressing       Comments: You may remove the bulky bandage (ACE wrap and gauze) two days after surgery. You will have an adhesive waterproof bandage underneath. Leave this in place until your first follow-up appointment.   10/15/22 0730            Diagnostic Studies: ECHOCARDIOGRAM COMPLETE  Result Date: 09/20/2022    ECHOCARDIOGRAM REPORT   Patient Name:   ARNELL MAUSOLF Date of Exam: 09/20/2022 Medical Rec #:   [id number]           Height:       61.0 in Accession #:    [id number]          Weight:       193.0 lb Date of Birth:  09/10/1946            BSA:          1.860 m Patient Age:    76 years            BP:           138/78 mmHg  Patient Gender: F                   HR:           57 bpm. Exam Location:  [address] Procedure: 2D Echo, Cardiac Doppler, Color Doppler, 3D Echo and Strain Analysis Indications:    Swelling of lower extermity M79.89  History:        Patient has no prior history of Echocardiogram examinations.                 Risk Factors:Hypertension and Diabetes.  Sonographer:    Mikki Santee RDCS Referring Phys: [place]  1. Left ventricular ejection fraction, by estimation, is 60 to 65%. The left ventricle has normal function. The left ventricle has no regional wall motion abnormalities. Left ventricular diastolic parameters were normal. The global longitudinal strain is normal.  2. Right ventricular systolic function is normal. The right ventricular size is normal. There is normal pulmonary artery systolic pressure.  3. Left atrial size was mildly dilated.  4. Trivial mitral valve regurgitation.  5. The aortic valve is tricuspid. Aortic valve regurgitation is not visualized.  6. The inferior vena cava is dilated in size with >50% respiratory variability, suggesting right atrial pressure of 8 mmHg. Comparison(s): No prior Echocardiogram. FINDINGS  Left Ventricle: Left ventricular ejection fraction, by estimation, is 60 to 65%. The left ventricle has normal function. The left ventricle has no regional wall motion abnormalities. The global longitudinal strain is normal. The left ventricular internal cavity size was normal in size. There is borderline left ventricular hypertrophy. Left ventricular diastolic parameters were normal. Right Ventricle: The right ventricular size is normal. Right ventricular systolic function is normal. There is normal pulmonary artery systolic pressure.  The tricuspid regurgitant velocity is 2.56 m/s, and with an assumed right atrial pressure of 8 mmHg,  the estimated right ventricular systolic pressure is 06.2 mmHg. Left Atrium: Left atrial size was mildly dilated. Right Atrium: Right atrial size was normal in size. Pericardium: There is no evidence of pericardial effusion. Mitral Valve: Trivial mitral valve regurgitation. Tricuspid Valve: Tricuspid valve regurgitation is mild. Aortic Valve: The aortic valve is tricuspid. Aortic valve regurgitation is not visualized. Pulmonic Valve: Pulmonic valve regurgitation is trivial. Aorta: The aortic root and ascending aorta are structurally normal, with no evidence of dilitation. Venous: The inferior vena cava is dilated in size with greater than 50% respiratory variability, suggesting right atrial pressure of 8 mmHg. IAS/Shunts: No atrial level shunt detected by color flow Doppler.  LEFT VENTRICLE PLAX 2D LVIDd:         4.30 cm   Diastology LVIDs:         2.50 cm   LV e' medial:    8.70 cm/s LV PW:         1.10 cm   LV E/e' medial:  9.6 LV IVS:        1.00 cm   LV e' lateral:   9.03 cm/s LVOT diam:     2.00 cm   LV E/e' lateral: 9.2 LV SV:         81 LV SV Index:   44 LVOT Area:     3.14 cm                           3D Volume EF:  3D EF:        64 %                          LV EDV:       91 ml                          LV ESV:       33 ml                          LV SV:        58 ml RIGHT VENTRICLE RV Basal diam:  3.80 cm RV Mid diam:    3.10 cm RV S prime:     14.50 cm/s TAPSE (M-mode): 2.5 cm LEFT ATRIUM             Index        RIGHT ATRIUM           Index LA diam:        4.00 cm 2.15 cm/m   RA Area:     15.10 cm LA Vol (A2C):   76.6 ml 41.17 ml/m  RA Volume:   33.40 ml  17.95 ml/m LA Vol (A4C):   62.2 ml 33.43 ml/m LA Biplane Vol: 70.5 ml 37.90 ml/m  AORTIC VALVE LVOT Vmax:   108.00 cm/s LVOT Vmean:  73.900 cm/s LVOT VTI:    0.259 m  AORTA Ao Root diam: 2.30 cm Ao Asc diam:  3.00 cm MITRAL  VALVE               TRICUSPID VALVE MV Area (PHT): 4.39 cm    TR Peak grad:   26.2 mmHg MV Decel Time: 173 msec    TR Vmax:        256.00 cm/s MV E velocity: 83.30 cm/s MV A velocity: 69.20 cm/s  SHUNTS MV E/A ratio:  1.20        Systemic VTI:  0.26 m                            Systemic Diam: 2.00 cm Phineas Inches Electronically signed by Phineas Inches Signature Date/Time: 09/20/2022/9:48:56 AM    Final     Disposition: Discharge disposition: 01-Home or Self Care       Discharge Instructions     Call MD / Call 911   Complete by: As directed    If you experience chest pain or shortness of breath, CALL 911 and be transported to the hospital emergency room.  If you develope a fever above 101 F, pus (white drainage) or increased drainage or redness at the wound, or calf pain, call your surgeon's office.   Change dressing   Complete by: As directed    You may remove the bulky bandage (ACE wrap and gauze) two days after surgery. You will have an adhesive waterproof bandage underneath. Leave this in place until your first follow-up appointment.   Constipation Prevention   Complete by: As directed    Drink plenty of fluids.  Prune juice may be helpful.  You may use a stool softener, such as Colace (over the counter) 100 mg twice a day.  Use MiraLax (over the counter) for constipation as needed.   Diet - low sodium heart healthy   Complete by: As directed    Do not put a  pillow under the knee. Place it under the heel.   Complete by: As directed    Driving restrictions   Complete by: As directed    No driving for two weeks   Post-operative opioid taper instructions:   Complete by: As directed    POST-OPERATIVE OPIOID TAPER INSTRUCTIONS: It is important to wean off of your opioid medication as soon as possible. If you do not need pain medication after your surgery it is ok to stop day one. Opioids include: Codeine, Hydrocodone(Norco, Vicodin), Oxycodone(Percocet, oxycontin) and hydromorphone amongst  others.  Long term and even short term use of opiods can cause: Increased pain response Dependence Constipation Depression Respiratory depression And more.  Withdrawal symptoms can include Flu like symptoms Nausea, vomiting And more Techniques to manage these symptoms Hydrate well Eat regular healthy meals Stay active Use relaxation techniques(deep breathing, meditating, yoga) Do Not substitute Alcohol to help with tapering If you have been on opioids for less than two weeks and do not have pain than it is ok to stop all together.  Plan to wean off of opioids This plan should start within one week post op of your joint replacement. Maintain the same interval or time between taking each dose and first decrease the dose.  Cut the total daily intake of opioids by one tablet each day Next start to increase the time between doses. The last dose that should be eliminated is the evening dose.      TED hose   Complete by: As directed    Use stockings (TED hose) for three weeks on both leg(s).  You may remove them at night for sleeping.   Weight bearing as tolerated   Complete by: As directed         Follow-up Information     Gaynelle Arabian, MD. Go on 10/30/2022.   Specialty: Orthopedic Surgery Why: You are scheduled for a follow up appointment on 10-30-22 at 2:15 pm. Contact information: [address] [place] [address] [phone]                  Signed: Theresa Duty 10/16/2022, 10:42 AM

## 2022-10-17 DIAGNOSIS — M25562 Pain in left knee: Secondary | ICD-10-CM | POA: Diagnosis not present

## 2022-10-17 DIAGNOSIS — E669 Obesity, unspecified: Secondary | ICD-10-CM | POA: Diagnosis not present

## 2022-10-17 DIAGNOSIS — Z6831 Body mass index (BMI) 31.0-31.9, adult: Secondary | ICD-10-CM | POA: Diagnosis not present

## 2022-10-21 DIAGNOSIS — M25562 Pain in left knee: Secondary | ICD-10-CM | POA: Diagnosis not present

## 2022-10-21 DIAGNOSIS — M25662 Stiffness of left knee, not elsewhere classified: Secondary | ICD-10-CM | POA: Diagnosis not present

## 2022-10-23 DIAGNOSIS — M25662 Stiffness of left knee, not elsewhere classified: Secondary | ICD-10-CM | POA: Diagnosis not present

## 2022-10-23 DIAGNOSIS — M25562 Pain in left knee: Secondary | ICD-10-CM | POA: Diagnosis not present

## 2022-10-25 DIAGNOSIS — M25562 Pain in left knee: Secondary | ICD-10-CM | POA: Diagnosis not present

## 2022-10-25 DIAGNOSIS — M25662 Stiffness of left knee, not elsewhere classified: Secondary | ICD-10-CM | POA: Diagnosis not present

## 2022-10-28 DIAGNOSIS — M25662 Stiffness of left knee, not elsewhere classified: Secondary | ICD-10-CM | POA: Diagnosis not present

## 2022-10-28 DIAGNOSIS — M25562 Pain in left knee: Secondary | ICD-10-CM | POA: Diagnosis not present

## 2022-10-30 DIAGNOSIS — M25662 Stiffness of left knee, not elsewhere classified: Secondary | ICD-10-CM | POA: Diagnosis not present

## 2022-10-30 DIAGNOSIS — M25562 Pain in left knee: Secondary | ICD-10-CM | POA: Diagnosis not present

## 2022-11-01 DIAGNOSIS — M25562 Pain in left knee: Secondary | ICD-10-CM | POA: Diagnosis not present

## 2022-11-01 DIAGNOSIS — M25662 Stiffness of left knee, not elsewhere classified: Secondary | ICD-10-CM | POA: Diagnosis not present

## 2022-11-04 DIAGNOSIS — M25662 Stiffness of left knee, not elsewhere classified: Secondary | ICD-10-CM | POA: Diagnosis not present

## 2022-11-04 DIAGNOSIS — M25562 Pain in left knee: Secondary | ICD-10-CM | POA: Diagnosis not present

## 2022-11-07 DIAGNOSIS — M25562 Pain in left knee: Secondary | ICD-10-CM | POA: Diagnosis not present

## 2022-11-07 DIAGNOSIS — M25662 Stiffness of left knee, not elsewhere classified: Secondary | ICD-10-CM | POA: Diagnosis not present

## 2022-11-13 DIAGNOSIS — M25662 Stiffness of left knee, not elsewhere classified: Secondary | ICD-10-CM | POA: Diagnosis not present

## 2022-11-13 DIAGNOSIS — M25562 Pain in left knee: Secondary | ICD-10-CM | POA: Diagnosis not present

## 2022-11-15 DIAGNOSIS — M25562 Pain in left knee: Secondary | ICD-10-CM | POA: Diagnosis not present

## 2022-11-15 DIAGNOSIS — M25662 Stiffness of left knee, not elsewhere classified: Secondary | ICD-10-CM | POA: Diagnosis not present

## 2022-11-19 DIAGNOSIS — M25562 Pain in left knee: Secondary | ICD-10-CM | POA: Diagnosis not present

## 2022-11-19 DIAGNOSIS — M25662 Stiffness of left knee, not elsewhere classified: Secondary | ICD-10-CM | POA: Diagnosis not present

## 2022-11-19 DIAGNOSIS — Z5189 Encounter for other specified aftercare: Secondary | ICD-10-CM | POA: Diagnosis not present

## 2022-11-22 DIAGNOSIS — M25662 Stiffness of left knee, not elsewhere classified: Secondary | ICD-10-CM | POA: Diagnosis not present

## 2022-11-22 DIAGNOSIS — M25562 Pain in left knee: Secondary | ICD-10-CM | POA: Diagnosis not present

## 2022-11-25 DIAGNOSIS — M25662 Stiffness of left knee, not elsewhere classified: Secondary | ICD-10-CM | POA: Diagnosis not present

## 2022-11-25 DIAGNOSIS — M25562 Pain in left knee: Secondary | ICD-10-CM | POA: Diagnosis not present

## 2022-11-27 DIAGNOSIS — M25662 Stiffness of left knee, not elsewhere classified: Secondary | ICD-10-CM | POA: Diagnosis not present

## 2022-11-27 DIAGNOSIS — M25562 Pain in left knee: Secondary | ICD-10-CM | POA: Diagnosis not present

## 2022-11-29 DIAGNOSIS — M25662 Stiffness of left knee, not elsewhere classified: Secondary | ICD-10-CM | POA: Diagnosis not present

## 2022-11-29 DIAGNOSIS — M25562 Pain in left knee: Secondary | ICD-10-CM | POA: Diagnosis not present

## 2022-12-02 DIAGNOSIS — M25562 Pain in left knee: Secondary | ICD-10-CM | POA: Diagnosis not present

## 2022-12-02 DIAGNOSIS — M25662 Stiffness of left knee, not elsewhere classified: Secondary | ICD-10-CM | POA: Diagnosis not present

## 2022-12-06 DIAGNOSIS — M25562 Pain in left knee: Secondary | ICD-10-CM | POA: Diagnosis not present

## 2022-12-06 DIAGNOSIS — Z1231 Encounter for screening mammogram for malignant neoplasm of breast: Secondary | ICD-10-CM | POA: Diagnosis not present

## 2022-12-06 DIAGNOSIS — M25662 Stiffness of left knee, not elsewhere classified: Secondary | ICD-10-CM | POA: Diagnosis not present

## 2022-12-09 ENCOUNTER — Encounter: Payer: Self-pay | Admitting: General Practice

## 2022-12-11 DIAGNOSIS — M25662 Stiffness of left knee, not elsewhere classified: Secondary | ICD-10-CM | POA: Diagnosis not present

## 2022-12-11 DIAGNOSIS — M25562 Pain in left knee: Secondary | ICD-10-CM | POA: Diagnosis not present

## 2022-12-13 DIAGNOSIS — M25562 Pain in left knee: Secondary | ICD-10-CM | POA: Diagnosis not present

## 2022-12-13 DIAGNOSIS — M25662 Stiffness of left knee, not elsewhere classified: Secondary | ICD-10-CM | POA: Diagnosis not present

## 2022-12-16 DIAGNOSIS — M25662 Stiffness of left knee, not elsewhere classified: Secondary | ICD-10-CM | POA: Diagnosis not present

## 2022-12-16 DIAGNOSIS — M25562 Pain in left knee: Secondary | ICD-10-CM | POA: Diagnosis not present

## 2022-12-17 DIAGNOSIS — E1165 Type 2 diabetes mellitus with hyperglycemia: Secondary | ICD-10-CM | POA: Diagnosis not present

## 2022-12-20 DIAGNOSIS — N1831 Chronic kidney disease, stage 3a: Secondary | ICD-10-CM | POA: Diagnosis not present

## 2022-12-20 DIAGNOSIS — E785 Hyperlipidemia, unspecified: Secondary | ICD-10-CM | POA: Diagnosis not present

## 2022-12-20 DIAGNOSIS — E1169 Type 2 diabetes mellitus with other specified complication: Secondary | ICD-10-CM | POA: Diagnosis not present

## 2022-12-20 DIAGNOSIS — E782 Mixed hyperlipidemia: Secondary | ICD-10-CM | POA: Diagnosis not present

## 2022-12-20 DIAGNOSIS — M25562 Pain in left knee: Secondary | ICD-10-CM | POA: Diagnosis not present

## 2022-12-20 DIAGNOSIS — M25662 Stiffness of left knee, not elsewhere classified: Secondary | ICD-10-CM | POA: Diagnosis not present

## 2022-12-20 DIAGNOSIS — I129 Hypertensive chronic kidney disease with stage 1 through stage 4 chronic kidney disease, or unspecified chronic kidney disease: Secondary | ICD-10-CM | POA: Diagnosis not present

## 2022-12-20 DIAGNOSIS — E559 Vitamin D deficiency, unspecified: Secondary | ICD-10-CM | POA: Diagnosis not present

## 2022-12-20 DIAGNOSIS — Z6835 Body mass index (BMI) 35.0-35.9, adult: Secondary | ICD-10-CM | POA: Diagnosis not present

## 2022-12-20 DIAGNOSIS — L209 Atopic dermatitis, unspecified: Secondary | ICD-10-CM | POA: Diagnosis not present

## 2022-12-20 DIAGNOSIS — I119 Hypertensive heart disease without heart failure: Secondary | ICD-10-CM | POA: Diagnosis not present

## 2022-12-25 DIAGNOSIS — M25662 Stiffness of left knee, not elsewhere classified: Secondary | ICD-10-CM | POA: Diagnosis not present

## 2022-12-25 DIAGNOSIS — M25562 Pain in left knee: Secondary | ICD-10-CM | POA: Diagnosis not present

## 2022-12-27 DIAGNOSIS — M25562 Pain in left knee: Secondary | ICD-10-CM | POA: Diagnosis not present

## 2022-12-27 DIAGNOSIS — M25662 Stiffness of left knee, not elsewhere classified: Secondary | ICD-10-CM | POA: Diagnosis not present

## 2023-01-03 DIAGNOSIS — M25662 Stiffness of left knee, not elsewhere classified: Secondary | ICD-10-CM | POA: Diagnosis not present

## 2023-01-03 DIAGNOSIS — M25562 Pain in left knee: Secondary | ICD-10-CM | POA: Diagnosis not present

## 2023-01-09 DIAGNOSIS — Z96652 Presence of left artificial knee joint: Secondary | ICD-10-CM | POA: Diagnosis not present

## 2023-01-09 DIAGNOSIS — I10 Essential (primary) hypertension: Secondary | ICD-10-CM | POA: Diagnosis not present

## 2023-01-09 DIAGNOSIS — E1169 Type 2 diabetes mellitus with other specified complication: Secondary | ICD-10-CM | POA: Diagnosis not present

## 2023-01-10 DIAGNOSIS — M25662 Stiffness of left knee, not elsewhere classified: Secondary | ICD-10-CM | POA: Diagnosis not present

## 2023-01-10 DIAGNOSIS — M25562 Pain in left knee: Secondary | ICD-10-CM | POA: Diagnosis not present

## 2023-01-10 DIAGNOSIS — I10 Essential (primary) hypertension: Secondary | ICD-10-CM | POA: Diagnosis not present

## 2023-01-10 DIAGNOSIS — D649 Anemia, unspecified: Secondary | ICD-10-CM | POA: Diagnosis not present

## 2023-01-10 DIAGNOSIS — E1169 Type 2 diabetes mellitus with other specified complication: Secondary | ICD-10-CM | POA: Diagnosis not present

## 2023-01-13 DIAGNOSIS — M25662 Stiffness of left knee, not elsewhere classified: Secondary | ICD-10-CM | POA: Diagnosis not present

## 2023-01-13 DIAGNOSIS — M25562 Pain in left knee: Secondary | ICD-10-CM | POA: Diagnosis not present

## 2023-01-15 DIAGNOSIS — E669 Obesity, unspecified: Secondary | ICD-10-CM | POA: Diagnosis not present

## 2023-01-15 DIAGNOSIS — Z6834 Body mass index (BMI) 34.0-34.9, adult: Secondary | ICD-10-CM | POA: Diagnosis not present

## 2023-01-16 DIAGNOSIS — M25662 Stiffness of left knee, not elsewhere classified: Secondary | ICD-10-CM | POA: Diagnosis not present

## 2023-01-16 DIAGNOSIS — M25562 Pain in left knee: Secondary | ICD-10-CM | POA: Diagnosis not present

## 2023-01-24 ENCOUNTER — Other Ambulatory Visit: Payer: Self-pay | Admitting: *Deleted

## 2023-01-24 DIAGNOSIS — Z5189 Encounter for other specified aftercare: Secondary | ICD-10-CM | POA: Diagnosis not present

## 2023-01-24 MED ORDER — AMLODIPINE BESYLATE 5 MG PO TABS: 5.0000 mg | ORAL_TABLET | Freq: Every day | ORAL | 2 refills | Status: AC

## 2023-02-16 DIAGNOSIS — Z6834 Body mass index (BMI) 34.0-34.9, adult: Secondary | ICD-10-CM | POA: Diagnosis not present

## 2023-02-16 DIAGNOSIS — E669 Obesity, unspecified: Secondary | ICD-10-CM | POA: Diagnosis not present

## 2023-02-21 DIAGNOSIS — E785 Hyperlipidemia, unspecified: Secondary | ICD-10-CM | POA: Diagnosis not present

## 2023-02-21 DIAGNOSIS — Z96653 Presence of artificial knee joint, bilateral: Secondary | ICD-10-CM | POA: Diagnosis not present

## 2023-02-21 DIAGNOSIS — E1169 Type 2 diabetes mellitus with other specified complication: Secondary | ICD-10-CM | POA: Diagnosis not present

## 2023-02-21 DIAGNOSIS — Z96651 Presence of right artificial knee joint: Secondary | ICD-10-CM | POA: Diagnosis not present

## 2023-02-21 DIAGNOSIS — E7849 Other hyperlipidemia: Secondary | ICD-10-CM | POA: Diagnosis not present

## 2023-02-21 DIAGNOSIS — Z96652 Presence of left artificial knee joint: Secondary | ICD-10-CM | POA: Diagnosis not present

## 2023-02-21 DIAGNOSIS — D649 Anemia, unspecified: Secondary | ICD-10-CM | POA: Diagnosis not present

## 2023-02-21 DIAGNOSIS — I10 Essential (primary) hypertension: Secondary | ICD-10-CM | POA: Diagnosis not present

## 2023-03-10 DIAGNOSIS — E119 Type 2 diabetes mellitus without complications: Secondary | ICD-10-CM | POA: Diagnosis not present

## 2023-03-10 DIAGNOSIS — Z87892 Personal history of anaphylaxis: Secondary | ICD-10-CM | POA: Diagnosis not present

## 2023-03-10 DIAGNOSIS — Z6834 Body mass index (BMI) 34.0-34.9, adult: Secondary | ICD-10-CM | POA: Diagnosis not present

## 2023-03-10 DIAGNOSIS — I10 Essential (primary) hypertension: Secondary | ICD-10-CM | POA: Diagnosis not present

## 2023-03-10 DIAGNOSIS — S139XXA Sprain of joints and ligaments of unspecified parts of neck, initial encounter: Secondary | ICD-10-CM | POA: Diagnosis not present

## 2023-03-17 DIAGNOSIS — E1165 Type 2 diabetes mellitus with hyperglycemia: Secondary | ICD-10-CM | POA: Diagnosis not present

## 2023-03-18 DIAGNOSIS — Z6835 Body mass index (BMI) 35.0-35.9, adult: Secondary | ICD-10-CM | POA: Diagnosis not present

## 2023-03-18 DIAGNOSIS — E669 Obesity, unspecified: Secondary | ICD-10-CM | POA: Diagnosis not present

## 2023-03-19 DIAGNOSIS — H1045 Other chronic allergic conjunctivitis: Secondary | ICD-10-CM | POA: Diagnosis not present

## 2023-03-19 DIAGNOSIS — J301 Allergic rhinitis due to pollen: Secondary | ICD-10-CM | POA: Diagnosis not present

## 2023-03-19 DIAGNOSIS — J3089 Other allergic rhinitis: Secondary | ICD-10-CM | POA: Diagnosis not present

## 2023-03-19 DIAGNOSIS — R053 Chronic cough: Secondary | ICD-10-CM | POA: Diagnosis not present

## 2023-03-19 DIAGNOSIS — R0600 Dyspnea, unspecified: Secondary | ICD-10-CM | POA: Diagnosis not present

## 2023-03-19 DIAGNOSIS — J3081 Allergic rhinitis due to animal (cat) (dog) hair and dander: Secondary | ICD-10-CM | POA: Diagnosis not present

## 2023-03-19 DIAGNOSIS — J4540 Moderate persistent asthma, uncomplicated: Secondary | ICD-10-CM | POA: Diagnosis not present

## 2023-04-18 DIAGNOSIS — Z6834 Body mass index (BMI) 34.0-34.9, adult: Secondary | ICD-10-CM | POA: Diagnosis not present

## 2023-04-18 DIAGNOSIS — E669 Obesity, unspecified: Secondary | ICD-10-CM | POA: Diagnosis not present

## 2023-05-18 DIAGNOSIS — Z6834 Body mass index (BMI) 34.0-34.9, adult: Secondary | ICD-10-CM | POA: Diagnosis not present

## 2023-05-18 DIAGNOSIS — E669 Obesity, unspecified: Secondary | ICD-10-CM | POA: Diagnosis not present

## 2023-05-21 ENCOUNTER — Ambulatory Visit (INDEPENDENT_AMBULATORY_CARE_PROVIDER_SITE_OTHER): Payer: Medicare PPO | Admitting: Obstetrics & Gynecology

## 2023-05-21 ENCOUNTER — Encounter: Payer: Self-pay | Admitting: Obstetrics & Gynecology

## 2023-05-21 VITALS — BP 111/87 | HR 57 | Ht 61.0 in | Wt 186.0 lb

## 2023-05-21 DIAGNOSIS — Z01419 Encounter for gynecological examination (general) (routine) without abnormal findings: Secondary | ICD-10-CM | POA: Diagnosis not present

## 2023-05-21 NOTE — Progress Notes (Signed)
Subjective:     Cheryl Good is a 77 y.o. female here for a routine exam.  Current complaints:  no GYN concerns. Planning to retire in Sept. Knows Dr. Gertie Fey in [place]    Gynecologic History No LMP recorded. Patient is postmenopausal. Contraception: post menopausal status Last Pap: 12/29/2019. Results were: normal Last mammogram: 12/09/2022 [facility] Results were: normal  Obstetric History OB History  Gravida Para Term Preterm AB Living  3 3 3  0 0 3  SAB IAB Ectopic Multiple Live Births  0 0 0   3    # Outcome Date GA Lbr Len/2nd Weight Sex Delivery Anes PTL Lv  3 Term         LIV  2 Term         LIV  1 Term         LIV     The following portions of the patient's history were reviewed and updated as appropriate: allergies, current medications, past family history, past medical history, past social history, past surgical history, and problem list.  Review of Systems Pertinent items are noted in HPI.    Objective:  BP 111/87   Pulse (!) 57   Ht 5\' 1"  (1.549 m)   Wt 186 lb (84.4 kg)   BMI 35.14 kg/m  General Appearance:    Alert, cooperative, no distress, appears stated age  Head:    Normocephalic, without obvious abnormality, atraumatic  Eyes:    conjunctiva/corneas clear, EOM's intact, both eyes  Ears:    Normal external ear canals, both ears  Nose:   Nares normal, septum midline, mucosa normal, no drainage    or sinus tenderness  Throat:   Lips, mucosa, and tongue normal; teeth and gums normal  Neck:   Supple, symmetrical, trachea midline, no adenopathy;    thyroid:  no enlargement/tenderness/nodules  Back:     Symmetric, no curvature, ROM normal, no CVA tenderness  Lungs:     respirations unlabored  Chest Wall:    No tenderness or deformity   Heart:    Regular rate and rhythm  Breast Exam:    No tenderness, masses, or nipple abnormality  Abdomen:     Soft, non-tender, bowel sounds active all four quadrants,    no masses, no organomegaly  Genitalia:    Normal  female without lesion, discharge or tenderness     Extremities:   Extremities normal, atraumatic, no cyanosis or edema  Pulses:   2+ and symmetric all extremities  Skin:   Skin color, texture, turgor normal, no rashes or lesions     Assessment:    Healthy female exam.    Plan:   Diagnoses and all orders for this visit:  Well female exam with routine gynecological exam   F/u in 1 year or sooner prn   Clayvon Parlett L. Harraway-Smith, M.D., [place]

## 2023-05-30 DIAGNOSIS — Z96653 Presence of artificial knee joint, bilateral: Secondary | ICD-10-CM | POA: Diagnosis not present

## 2023-06-11 ENCOUNTER — Encounter: Payer: Self-pay | Admitting: Obstetrics & Gynecology

## 2023-06-15 DIAGNOSIS — E1165 Type 2 diabetes mellitus with hyperglycemia: Secondary | ICD-10-CM | POA: Diagnosis not present

## 2023-06-18 DIAGNOSIS — E669 Obesity, unspecified: Secondary | ICD-10-CM | POA: Diagnosis not present

## 2023-06-18 DIAGNOSIS — Z6835 Body mass index (BMI) 35.0-35.9, adult: Secondary | ICD-10-CM | POA: Diagnosis not present

## 2023-06-23 ENCOUNTER — Ambulatory Visit: Admission: RE | Admit: 2023-06-23 | Payer: Medicare PPO | Source: Ambulatory Visit

## 2023-06-23 ENCOUNTER — Other Ambulatory Visit: Payer: Self-pay | Admitting: Family Medicine

## 2023-06-23 DIAGNOSIS — E1169 Type 2 diabetes mellitus with other specified complication: Secondary | ICD-10-CM | POA: Diagnosis not present

## 2023-06-23 DIAGNOSIS — J329 Chronic sinusitis, unspecified: Secondary | ICD-10-CM | POA: Diagnosis not present

## 2023-06-23 DIAGNOSIS — N183 Chronic kidney disease, stage 3 unspecified: Secondary | ICD-10-CM | POA: Diagnosis not present

## 2023-06-23 DIAGNOSIS — Z6836 Body mass index (BMI) 36.0-36.9, adult: Secondary | ICD-10-CM | POA: Diagnosis not present

## 2023-06-23 DIAGNOSIS — N1831 Chronic kidney disease, stage 3a: Secondary | ICD-10-CM | POA: Diagnosis not present

## 2023-06-23 DIAGNOSIS — J018 Other acute sinusitis: Secondary | ICD-10-CM

## 2023-06-23 DIAGNOSIS — M130 Polyarthritis, unspecified: Secondary | ICD-10-CM | POA: Diagnosis not present

## 2023-06-23 DIAGNOSIS — I10 Essential (primary) hypertension: Secondary | ICD-10-CM | POA: Diagnosis not present

## 2023-07-11 DIAGNOSIS — B360 Pityriasis versicolor: Secondary | ICD-10-CM | POA: Diagnosis not present

## 2023-07-11 DIAGNOSIS — L308 Other specified dermatitis: Secondary | ICD-10-CM | POA: Diagnosis not present

## 2023-07-19 DIAGNOSIS — Z6835 Body mass index (BMI) 35.0-35.9, adult: Secondary | ICD-10-CM | POA: Diagnosis not present

## 2023-07-19 DIAGNOSIS — E669 Obesity, unspecified: Secondary | ICD-10-CM | POA: Diagnosis not present

## 2023-08-01 DIAGNOSIS — Z961 Presence of intraocular lens: Secondary | ICD-10-CM | POA: Diagnosis not present

## 2023-08-01 DIAGNOSIS — H04123 Dry eye syndrome of bilateral lacrimal glands: Secondary | ICD-10-CM | POA: Diagnosis not present

## 2023-08-01 DIAGNOSIS — Z0001 Encounter for general adult medical examination with abnormal findings: Secondary | ICD-10-CM | POA: Diagnosis not present

## 2023-08-01 DIAGNOSIS — H26493 Other secondary cataract, bilateral: Secondary | ICD-10-CM | POA: Diagnosis not present

## 2023-08-01 DIAGNOSIS — I10 Essential (primary) hypertension: Secondary | ICD-10-CM | POA: Diagnosis not present

## 2023-08-01 DIAGNOSIS — H40013 Open angle with borderline findings, low risk, bilateral: Secondary | ICD-10-CM | POA: Diagnosis not present

## 2023-08-01 DIAGNOSIS — E119 Type 2 diabetes mellitus without complications: Secondary | ICD-10-CM | POA: Diagnosis not present

## 2023-08-11 DIAGNOSIS — I10 Essential (primary) hypertension: Secondary | ICD-10-CM | POA: Diagnosis not present

## 2023-08-11 DIAGNOSIS — E1169 Type 2 diabetes mellitus with other specified complication: Secondary | ICD-10-CM | POA: Diagnosis not present

## 2023-08-18 DIAGNOSIS — Z6836 Body mass index (BMI) 36.0-36.9, adult: Secondary | ICD-10-CM | POA: Diagnosis not present

## 2023-08-18 DIAGNOSIS — E669 Obesity, unspecified: Secondary | ICD-10-CM | POA: Diagnosis not present

## 2023-09-10 DIAGNOSIS — R0600 Dyspnea, unspecified: Secondary | ICD-10-CM | POA: Diagnosis not present

## 2023-09-10 DIAGNOSIS — J302 Other seasonal allergic rhinitis: Secondary | ICD-10-CM | POA: Diagnosis not present

## 2023-09-10 DIAGNOSIS — R053 Chronic cough: Secondary | ICD-10-CM | POA: Diagnosis not present

## 2023-09-10 DIAGNOSIS — J301 Allergic rhinitis due to pollen: Secondary | ICD-10-CM | POA: Diagnosis not present

## 2023-09-10 DIAGNOSIS — J3081 Allergic rhinitis due to animal (cat) (dog) hair and dander: Secondary | ICD-10-CM | POA: Diagnosis not present

## 2023-09-10 DIAGNOSIS — H1045 Other chronic allergic conjunctivitis: Secondary | ICD-10-CM | POA: Diagnosis not present

## 2023-09-10 DIAGNOSIS — J4540 Moderate persistent asthma, uncomplicated: Secondary | ICD-10-CM | POA: Diagnosis not present

## 2023-09-10 DIAGNOSIS — J3089 Other allergic rhinitis: Secondary | ICD-10-CM | POA: Diagnosis not present

## 2023-09-12 ENCOUNTER — Encounter (HOSPITAL_BASED_OUTPATIENT_CLINIC_OR_DEPARTMENT_OTHER): Payer: Self-pay

## 2023-09-12 ENCOUNTER — Emergency Department (HOSPITAL_BASED_OUTPATIENT_CLINIC_OR_DEPARTMENT_OTHER)
Admission: EM | Admit: 2023-09-12 | Discharge: 2023-09-12 | Disposition: A | Discharge status: Home / Self Care | Payer: Medicare PPO | Attending: Emergency Medicine | Admitting: Emergency Medicine

## 2023-09-12 ENCOUNTER — Other Ambulatory Visit: Payer: Self-pay

## 2023-09-12 DIAGNOSIS — G5602 Carpal tunnel syndrome, left upper limb: Secondary | ICD-10-CM | POA: Diagnosis not present

## 2023-09-12 DIAGNOSIS — I10 Essential (primary) hypertension: Secondary | ICD-10-CM | POA: Diagnosis not present

## 2023-09-12 DIAGNOSIS — Z79899 Other long term (current) drug therapy: Secondary | ICD-10-CM | POA: Insufficient documentation

## 2023-09-12 DIAGNOSIS — Z7984 Long term (current) use of oral hypoglycemic drugs: Secondary | ICD-10-CM | POA: Insufficient documentation

## 2023-09-12 DIAGNOSIS — E119 Type 2 diabetes mellitus without complications: Secondary | ICD-10-CM | POA: Insufficient documentation

## 2023-09-12 DIAGNOSIS — R200 Anesthesia of skin: Secondary | ICD-10-CM | POA: Diagnosis present

## 2023-09-12 MED ORDER — DEXAMETHASONE SODIUM PHOSPHATE 10 MG/ML IJ SOLN
10.0000 mg | Freq: Once | INTRAMUSCULAR | Status: AC
  Administered 2023-09-12: 10 mg via INTRAMUSCULAR
  Filled 2023-09-12: qty 1

## 2023-09-12 NOTE — ED Provider Notes (Signed)
[place] EMERGENCY DEPARTMENT AT [facility] Provider Note   CSN: [id number] Arrival date & time: 09/12/23  2125     History  Chief Complaint  Patient presents with   Hand Pain   Numbness    Cheryl Good is a 77 y.o. female.  HPI     This is a 77 year old female with history of diabetes who presents with left hand numbness and pain in the left wrist.  Patient reports onset of symptoms several days ago but woke up acutely this morning with severe right hand pain and numbness.  She states initially several days ago she had numbness just in the tip of the left second digit.  She has pain in the left wrist that radiates into the elbow as well as into the hand.  She has not taken anything for this.  She has a history of carpal tunnel bilaterally and has had carpal tunnel release on the right.  She states that she had some mild symptoms on the left previously.  No recent injury.  Denies any systemic symptoms such as speech difficulty, vision changes, neck pain, weakness.  Home Medications Prior to Admission medications   Medication Sig Start Date End Date Taking? Authorizing Provider  albuterol (PROVENTIL HFA;VENTOLIN HFA) 108 (90 Base) MCG/ACT inhaler Inhale 1-2 puffs into the lungs every 6 (six) hours as needed for wheezing or shortness of breath.    [provider]  amLODipine (NORVASC) 5 MG tablet Take 1 tablet (5 mg total) by mouth daily. 01/24/23   Meriam Sprague, MD  Calcium Carbonate Antacid (CALCIUM CARBONATE PO) Take 1 capsule by mouth daily.    [provider]  chlorthalidone (HYGROTON) 25 MG tablet Take 25 mg by mouth daily. Patient not taking: Reported on 05/21/2023 08/16/19   [provider]  Cholecalciferol (VITAMIN D3 PO) Take 1 tablet by mouth daily.    [provider]  ENTRESTO 24-26 MG Take 1 tablet by mouth 2 (two) times daily. 10/01/19   [provider]  EPINEPHrine 0.3 mg/0.3 mL IJ SOAJ injection Inject  0.3 mg into the muscle as needed for anaphylaxis.    [provider]  estradiol (ESTRACE) 0.1 MG/GM vaginal cream Insert 0.5 grams 3 times per week. 10/15/21   [name], MD  FARXIGA 10 MG TABS tablet Take 10 mg by mouth daily. 08/22/22   [provider]  fexofenadine (ALLEGRA) 180 MG tablet Take 180 mg by mouth daily.    [provider]  fluticasone (FLONASE) 50 MCG/ACT nasal spray Place 2 sprays into both nostrils daily.    [provider]  glimepiride (AMARYL) 2 MG tablet Take 1 mg by mouth daily with breakfast.    [provider]  glucose blood (ONETOUCH VERIO) test strip  11/17/16   [provider]  HYDROcodone-acetaminophen (NORCO/VICODIN) 5-325 MG tablet Take 1-2 tablets by mouth every 6 (six) hours as needed for moderate pain or severe pain. Patient not taking: Reported on 05/21/2023 10/15/22   Derenda Fennel, PA  metFORMIN (GLUCOPHAGE) 500 MG tablet Take 500 mg by mouth 2 (two) times daily with a meal. 07/06/16   [provider]  methocarbamol (ROBAXIN) 500 MG tablet Take 1 tablet (500 mg total) by mouth every 6 (six) hours as needed for muscle spasms. 10/15/22   Edmisten, Kristie L, PA  montelukast (SINGULAIR) 10 MG tablet Take 10 mg by mouth daily.    [provider]  olopatadine (PATADAY) 0.1 % ophthalmic solution Place 1 drop  into both eyes daily. Patient not taking: Reported on 05/21/2023    [provider]  rosuvastatin (CRESTOR) 5 MG tablet Take 1 tablet (5 mg total) by mouth daily. 10/15/22   Lyn Records, MD  Semaglutide, 2 MG/DOSE, (OZEMPIC, 2 MG/DOSE,) 8 MG/3ML SOPN Inject 2 mg into the skin every Saturday.    [provider]  UNABLE TO FIND Allergy vaccines, takes one injection weekly and dosages change when she changes vial Patient not taking: Reported on 05/21/2023    [provider]      Allergies    Betadine [povidone iodine], Bupivacaine, Chloroprocaine,  Etidocaine, Lidocaine, Mepivacaine, Other, Prilocaine, Procaine, Tetracaine, Gadolinium derivatives, Codeine, and Demerol    Review of Systems   Review of Systems  Constitutional:  Negative for fever.  Respiratory:  Negative for shortness of breath.   Cardiovascular:  Negative for chest pain.  Neurological:  Positive for numbness. Negative for weakness.  All other systems reviewed and are negative.   Physical Exam Updated Vital Signs BP (!) 145/68   Pulse (!) 52   Temp 97.9 F (36.6 C)   Resp 18   Ht 1.549 m (5\' 1" )   Wt 83.9 kg   SpO2 98%   BMI 34.96 kg/m  Physical Exam Vitals and nursing note reviewed.  Constitutional:      Appearance: She is well-developed. She is not ill-appearing.  HENT:     Head: Normocephalic and atraumatic.  Eyes:     Pupils: Pupils are equal, round, and reactive to light.  Cardiovascular:     Rate and Rhythm: Normal rate and regular rhythm.  Pulmonary:     Effort: Pulmonary effort is normal. No respiratory distress.  Abdominal:     Palpations: Abdomen is soft.  Musculoskeletal:     Cervical back: Neck supple.     Comments: Tenderness to palpation over the left carpal tunnel, no obvious swelling  Skin:    General: Skin is warm and dry.  Neurological:     Mental Status: She is alert and oriented to person, place, and time.     Comments: 5 out of 5 grip, biceps, triceps strength on the left, normal wrist extension and flexion  Psychiatric:        Mood and Affect: Mood normal.     ED Results / Procedures / Treatments   Labs (all labs ordered are listed, but only abnormal results are displayed) Labs Reviewed - No data to display  EKG None  Radiology No results found.  Procedures Procedures    Medications Ordered in ED Medications  dexamethasone (DECADRON) injection 10 mg (has no administration in time range)    ED Course/ Medical Decision Making/ A&P                                 Medical Decision  Making Risk Prescription drug management.   This patient presents to the ED for concern of left wrist pain and hand numbness, this involves an extensive number of treatment options, and is a complaint that carries with it a high risk of complications and morbidity.  I considered the following differential and admission for this acute, potentially life threatening condition.  The differential diagnosis includes radicular pain, carpal tunnel syndrome, less likely stroke  MDM:    This is a 77 year old female who presents with left wrist pain and numbness.  Nontoxic-appearing.  Vital signs are reassuring.  She has tenderness over  the left carpal tunnel and given her history, highly suspect that this is likely carpal tunnel.  Exam is reassuring and she does not have any weakness.  Will provide with a wrist splint and patient was given 1 dose of Decadron here.  She can follow-up with her prior hand surgeon and was also given hand surgery on-call.  (Labs, imaging, consults)  Labs: I Ordered, and personally interpreted labs.  The pertinent results include: N/A  Imaging Studies ordered: I ordered imaging studies including N/A I independently visualized and interpreted imaging. I agree with the radiologist interpretation  Additional history obtained from chart review.  External records from outside source obtained and reviewed including prior evaluations  Cardiac Monitoring: The patient was not maintained on a cardiac monitor.  If on the cardiac monitor, I personally viewed and interpreted the cardiac monitored which showed an underlying rhythm of: N/A I  Reevaluation: After the interventions noted above, I reevaluated the patient and found that they have :stayed the same  Social Determinants of Health:  lives independently  Disposition: Discharge  Co morbidities that complicate the patient evaluation  Past Medical History:  Diagnosis Date   Allergy    Arthritis    Chicken pox     Complication of anesthesia    laryngospasms with amino esters; sensitive to anesthesia   Diabetes mellitus    Heart murmur    Hypertension    Measles    Mumps    Rubella    Trichomonas    Yeast infection      Medicines Meds ordered this encounter  Medications   dexamethasone (DECADRON) injection 10 mg    I have reviewed the patients home medicines and have made adjustments as needed  Problem List / ED Course: Problem List Items Addressed This Visit   None Visit Diagnoses     Carpal tunnel syndrome of left wrist    -  Primary                   Final Clinical Impression(s) / ED Diagnoses Final diagnoses:  Carpal tunnel syndrome of left wrist    Rx / DC Orders ED Discharge Orders     None         Shon Baton, MD 09/12/23 2329

## 2023-09-12 NOTE — Discharge Instructions (Signed)
You were seen today with pain in the left wrist and numbness in the left hand.  Your exam and history is consistent with carpal tunnel syndrome.  Wear brace for comfort.  You were given a dose of steroids.  Follow-up with your hand surgeon.  If you no longer have contact with your hand surgeon, you may follow-up with Dr. Janee Morn.

## 2023-09-12 NOTE — ED Triage Notes (Signed)
Pt reports pain and numbness to L hand x3 days. Pt states that pain shoots from hand up to elbow. Pt has h/x carpal tunnel.

## 2023-09-13 DIAGNOSIS — G5602 Carpal tunnel syndrome, left upper limb: Secondary | ICD-10-CM | POA: Diagnosis not present

## 2023-09-13 DIAGNOSIS — E1165 Type 2 diabetes mellitus with hyperglycemia: Secondary | ICD-10-CM | POA: Diagnosis not present

## 2023-09-18 DIAGNOSIS — Z6836 Body mass index (BMI) 36.0-36.9, adult: Secondary | ICD-10-CM | POA: Diagnosis not present

## 2023-09-18 DIAGNOSIS — E669 Obesity, unspecified: Secondary | ICD-10-CM | POA: Diagnosis not present

## 2023-09-23 DIAGNOSIS — G5602 Carpal tunnel syndrome, left upper limb: Secondary | ICD-10-CM | POA: Diagnosis not present

## 2023-10-02 DIAGNOSIS — I10 Essential (primary) hypertension: Secondary | ICD-10-CM | POA: Diagnosis not present

## 2023-10-02 DIAGNOSIS — G5602 Carpal tunnel syndrome, left upper limb: Secondary | ICD-10-CM | POA: Diagnosis not present

## 2023-10-02 DIAGNOSIS — E1169 Type 2 diabetes mellitus with other specified complication: Secondary | ICD-10-CM | POA: Diagnosis not present

## 2023-10-07 ENCOUNTER — Ambulatory Visit: Payer: Self-pay | Admitting: Allergy & Immunology

## 2023-10-07 ENCOUNTER — Other Ambulatory Visit: Payer: Self-pay

## 2023-10-07 ENCOUNTER — Encounter: Payer: Self-pay | Admitting: Allergy & Immunology

## 2023-10-07 VITALS — BP 112/82 | HR 66 | Temp 97.9°F | Resp 15 | Ht 60.5 in | Wt 192.7 lb

## 2023-10-07 DIAGNOSIS — J4530 Mild persistent asthma, uncomplicated: Secondary | ICD-10-CM

## 2023-10-07 DIAGNOSIS — J310 Chronic rhinitis: Secondary | ICD-10-CM | POA: Diagnosis not present

## 2023-10-07 DIAGNOSIS — L2089 Other atopic dermatitis: Secondary | ICD-10-CM

## 2023-10-07 DIAGNOSIS — B999 Unspecified infectious disease: Secondary | ICD-10-CM | POA: Diagnosis not present

## 2023-10-07 MED ORDER — AZELASTINE HCL 0.1 % NA SOLN: 1.0000 | Freq: Two times a day (BID) | NASAL | 5 refills | Status: DC | PRN

## 2023-10-07 NOTE — Patient Instructions (Addendum)
1. Mild persistent asthma, uncomplicated - Lung testing looked good today. - I do not think that we need to do any daily controller medication at this time.  - Daily controller medication(s): NOTHING - Prior to physical activity: albuterol 2 puffs 10-15 minutes before physical activity. - Rescue medications: albuterol 4 puffs every 4-6 hours as needed - Asthma control goals:  * Full participation in all desired activities (may need albuterol before activity) * Albuterol use two time or less a week on average (not counting use with activity) * Cough interfering with sleep two time or less a month * Oral steroids no more than once a year * No hospitalizations  2. Chronic rhinitis - We will get environmental allergy testing done via the blood to see where you are at with your allergies. - We will call you with the results of the testing in 1-2 weeks.  - In the meantime, continue with the use of Allegra daily and Flonase daily. - Add on Astelin one spray per nostril twice daily on particularly bad days.  - This is a nasal antihistamine and can help with mucous production and allergic reactions in the nasal cavity from exposure to   3. Recurrent infections - We will obtain some screening labs to evaluate your immune system.  - Labs to evaluate the quantitative [facility]) aspects of your immune system: IgG/IgA/IgM, CBC with differential - Labs to evaluate the qualitative (HOW WELL THEY WORK) aspects of your immune system: CH50, Pneumococcal titers, Tetanus titers, Diphtheria titers - We may consider immunizations with Pneumovax and Tdap to challenge your immune system, and then obtain repeat titers in 4-6 weeks.   4. Eczema - Continue with the compounded eczema cream that you are using. - OK to use over the counter hydrocortisone on your face if needed.   - Continue to follow with Dr. Margo Aye.   5. Return in about 2 months (around 12/07/2023). You can have the follow up appointment with Dr.  Dellis Anes or a Nurse Practicioner (our Nurse Practitioners are excellent and always have Physician oversight!).    Please inform us of any Emergency Department visits, hospitalizations, or changes in symptoms. Call us before going to the ED for breathing or allergy symptoms since we might be able to fit you in for a sick visit. Feel free to contact us anytime with any questions, problems, or concerns.  It was a pleasure to meet you today!  Websites that have reliable patient information: 1. American Academy of Asthma, Allergy, and Immunology: www.aaaai.org 2. Food Allergy Research and Education (FARE): foodallergy.org 3. Mothers of Asthmatics: http://www.asthmacommunitynetwork.org 4. American College of Allergy, Asthma, and Immunology: www.acaai.org      "Like" [place] on Facebook and Instagram for our latest updates!      A healthy democracy works best when [employer] participate! Make sure you are registered to vote! If you have moved or changed any of your contact information, you will need to get this updated before voting! Scan the QR codes below to learn more!

## 2023-10-07 NOTE — Progress Notes (Unsigned)
NEW PATIENT  Date of Service/Encounter:  10/07/23  Consult requested by: Renaye Rakers, MD   Assessment:   Mild persistent asthma, uncomplicated  Chronic rhinitis - getting environmental allergy testing via the blood  Recurrent infections - getting immune workup  Flexural atopic dermatitis - followed by Dermatology (Dr. 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 Mild persistent asthma, uncomplicated - Lung testing looked good today. - I do not think that we need to do any daily controller medication at this time.  - Daily controller medication(s): NOTHING - Prior to physical activity: albuterol 2 puffs 10-15 minutes before physical activity. - Rescue medications: albuterol 4 puffs every 4-6 hours as needed - Asthma control goals:  * Full participation in all desired activities (may need albuterol before activity) * Albuterol use two time or less a week on average (not counting use with activity) * Cough interfering with sleep two time or less a month * Oral steroids no more than once a year * No hospitalizations  2. Chronic rhinitis - We will get environmental allergy testing done via the blood to see where you are at with your allergies. - We will call you with the results of the testing in 1-2 weeks.  - In the meantime, continue with the use of Allegra daily and Flonase daily. - Add on Astelin one spray per nostril twice daily on particularly bad days.  - This is a nasal antihistamine and can help with mucous production and allergic reactions in the nasal cavity from exposure to   3. Recurrent infections - We will obtain some screening labs to evaluate your immune system.  - Labs to evaluate the quantitative [facility]) aspects of your immune system: IgG/IgA/IgM, CBC with differential - Labs to  evaluate the qualitative (HOW WELL THEY WORK) aspects of your immune system: CH50, Pneumococcal titers, Tetanus titers, Diphtheria titers - We may consider immunizations with Pneumovax and Tdap to challenge your immune system, and then obtain repeat titers in 4-6 weeks.   4. Eczema - Continue with the compounded eczema cream that you are using. - OK to use over the counter hydrocortisone on your face if needed.   - Continue to follow with Dr. Margo Aye.   5. Return in about 2 months (around 12/07/2023). You can have the follow up appointment with Dr. Dellis Anes or a Nurse Practicioner (our Nurse Practitioners are excellent and always have Physician oversight!).    This note in its entirety was forwarded to the Provider who requested this consultation.  Subjective:   Cheryl Good is a 77 y.o. 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  Discussed the use of AI scribe software for clinical note transcription with the patient and/or guardian, who gave verbal consent to proceed.  Cheryl Good was referred by Renaye Rakers, MD.     Cheryl Good is a 77 y.o. female presenting for an evaluation  of asthma, allergies and recurrent sinusitis .  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    Asthma/Respiratory Symptom History: She reports a history of mild asthma, which is exacerbated by allergens, leading to laryngospasms and bronchospasms. The patient uses albuterol for relief, which she reports as  effective. She has needed to use it twice since returning home from work-related travel. The patient has a history of frequent travel for work, which involved staying in different environments, including [address] and [place]. She reports that her symptoms were particularly severe in [place] due to poor air quality, leading to frequent use of albuterol. She also reports multiple bouts of sinusitis and bronchitis during her time in [place], which were treated with antibiotics and Breo.  Allergic Rhinitis Symptom History: The patient has a history of receiving allergy shots, which she reports as tremendously helpful. She stopped her allergy shots in 2023 after noting improvement in her condition. She is currently using Flonase and Allegra for her allergies.   Skin Symptom History: She also reports a recent onset of eczema, which started about two to three months ago and is being treated with a compound medication containing triamcinolone and an antifungal.  The patient reports no food allergies, except for hot, spicy things which can trigger laryngospasms and bronchospasms. She carries an EpiPen for emergencies. She also reports a low tolerance for medication, which can cause her to sleep for extended periods after taking certain drugs like Benadryl.    The patient also reports a recent episode of carpal tunnel syndrome, which occurred after a period of intense work-related activity. She was treated in the emergency room and is due for a follow-up in a month. She has had surgery for carpal tunnel syndrome in the past.  Otherwise, there is no history of other atopic diseases, including drug allergies, stinging insect allergies, or contact dermatitis. There is no significant infectious history. Vaccinations are up to date.    Past Medical History: Patient Active Problem List   Diagnosis Date Noted   OA (osteoarthritis) of knee 10/14/2022   Osteoarthritis of left knee 10/14/2022   S/P total knee replacement  10/14/2016   Postcoital bleeding 02/26/2012   Vaginal atrophy 01/28/2012    Medication List:  Allergies as of 10/07/2023       Reactions   Betadine [povidone Iodine] Anaphylaxis   Bupivacaine Anaphylaxis   Chloroprocaine Anaphylaxis   Etidocaine Anaphylaxis   Lidocaine Anaphylaxis   Pt states that she can tolerate lidocaine   Mepivacaine Anaphylaxis   Other Anaphylaxis, Shortness Of Breath   Anesthetics AMINO ESTERS "caines"   Prilocaine Anaphylaxis   Procaine Anaphylaxis   Tetracaine Anaphylaxis   Gadolinium Derivatives    Other reaction(s): Respiratory distress, Respiratory Distress (ALLERGY/intolerance)   Codeine Nausea And Vomiting   Demerol Other (See Comments)   Hallucinations        Medication List        Accurate as of October 07, 2023 11:59 PM. If you have any questions, ask your nurse or doctor.          STOP taking these medications    HYDROcodone-acetaminophen 5-325 MG tablet Commonly known as: NORCO/VICODIN Stopped by: Ignacia Marvel TO FIND Stopped by: Alfonse Spruce       TAKE these medications    albuterol 108 (90 Base) MCG/ACT inhaler Commonly known as: VENTOLIN HFA Inhale 1-2 puffs into the lungs every  6 (six) hours as needed for wheezing or shortness of breath.   amLODipine 5 MG tablet Commonly known as: NORVASC Take 1 tablet (5 mg total) by mouth daily.   azelastine 0.1 % nasal spray Commonly known as: ASTELIN Place 1 spray into both nostrils 2 (two) times daily as needed for rhinitis. Use in each nostril as directed Started by: Alfonse Spruce   CALCIUM CARBONATE PO Take 1 capsule by mouth daily.   chlorthalidone 25 MG tablet Commonly known as: HYGROTON Take 25 mg by mouth daily.   diclofenac Sodium 1 % Gel Commonly known as: VOLTAREN Use topically as directed for pain for 3 to 7 days as needed.Do not apply to open wounds/ infections/exfoliative dermatitis. For joints only   Entresto 24-26  MG Generic drug: sacubitril-valsartan Take 1 tablet by mouth 2 (two) times daily.   EPINEPHrine 0.3 mg/0.3 mL Soaj injection Commonly known as: EPI-PEN Inject 0.3 mg into the muscle as needed for anaphylaxis.   estradiol 0.1 MG/GM vaginal cream Commonly known as: ESTRACE Insert 0.5 grams 3 times per week.   Farxiga 10 MG Tabs tablet Generic drug: dapagliflozin propanediol Take 10 mg by mouth daily.   fexofenadine 180 MG tablet Commonly known as: ALLEGRA Take 180 mg by mouth daily.   fluticasone 50 MCG/ACT nasal spray Commonly known as: FLONASE Place 2 sprays into both nostrils daily.   glimepiride 2 MG tablet Commonly known as: AMARYL Take 1 mg by mouth daily with breakfast.   metFORMIN 500 MG tablet Commonly known as: GLUCOPHAGE Take 500 mg by mouth 2 (two) times daily with a meal.   methocarbamol 500 MG tablet Commonly known as: ROBAXIN Take 1 tablet (500 mg total) by mouth every 6 (six) hours as needed for muscle spasms.   montelukast 10 MG tablet Commonly known as: SINGULAIR Take 10 mg by mouth daily.   OneTouch Verio test strip Generic drug: glucose blood   Ozempic (2 MG/DOSE) 8 MG/3ML Sopn Generic drug: Semaglutide (2 MG/DOSE) Inject 2 mg into the skin every Saturday.   Pataday 0.1 % ophthalmic solution Generic drug: olopatadine Place 1 drop into both eyes daily.   rosuvastatin 5 MG tablet Commonly known as: CRESTOR Take 1 tablet (5 mg total) by mouth daily.   VITAMIN D3 PO Take 1 tablet by mouth daily.        Birth History: non-contributory  Developmental History: non-contributory  Past Surgical History: Past Surgical History:  Procedure Laterality Date   BREAST LUMPECTOMY     left lumpectomy   BUNIONECTOMY Left    CARPAL TUNNEL RELEASE Right 11/06/2017   Procedure: RIGHT CARPAL TUNNEL RELEASE;  Surgeon: Cindee Salt, MD;  Location: [facility];  Service: Orthopedics;  Laterality: Right;   COLONOSCOPY     orthoscopic knee  surgery     TOTAL KNEE ARTHROPLASTY Right 10/14/2016   Procedure: RIGHT TOTAL KNEE ARTHROPLASTY;  Surgeon: Dannielle Huh, MD;  Location: [facility];  Service: Orthopedics;  Laterality: Right;   TOTAL KNEE ARTHROPLASTY Left 10/14/2022   Procedure: TOTAL KNEE ARTHROPLASTY;  Surgeon: Ollen Gross, MD;  Location: [facility];  Service: Orthopedics;  Laterality: Left;   TRIGGER FINGER RELEASE Right 11/06/2017   Procedure: RIGHT RELEASE TRIGGER FINGER/A-1 PULLEY;  Surgeon: Cindee Salt, MD;  Location: [facility];  Service: Orthopedics;  Laterality: Right;     Family History: Family History  Problem Relation Age of Onset   Angioedema Mother    Urticaria Mother    Eczema Mother  Hypertension Mother    Diabetes Maternal Grandmother    Cancer Maternal Grandmother      Social History: Gerline lives at home with her husband.  They live in a house that is 22 years old.  There is a sump pump in the crawl space.  There is hardwood in the main living areas.  They have tile in the bedroom.  They have electric heating and central cooling.  There are no animals inside or outside of the home.  There are no dust mite covers on the bedding.  There is no tobacco exposure.  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  She is exposed to dust in her workplace.  She does have a HEPA filter in her home.  She does not live near an interstate or industrial area.  She has 5 children who are all grown.  All of them live in the [place] aside from a Forensic scientist who lives in [place].    Review of systems otherwise negative other than that mentioned in the HPI.    Objective:   Blood pressure 112/82, pulse 66, temperature 97.9 F (36.6 C), temperature source Temporal, resp. rate 15, height 5' 0.5" (1.537 m), weight 192 lb 11.2 oz (87.4 kg), SpO2 97%. Body mass index is 37.01 kg/m.     Physical Exam Vitals  reviewed.  Constitutional:      Appearance: She is well-developed.  HENT:     Head: Normocephalic and atraumatic.     Right Ear: Tympanic membrane, ear canal and external ear normal. No drainage, swelling or tenderness. Tympanic membrane is not injected, scarred, erythematous, retracted or bulging.     Left Ear: Tympanic membrane, ear canal and external ear normal. No drainage, swelling or tenderness. Tympanic membrane is not injected, scarred, erythematous, retracted or bulging.     Nose: Rhinorrhea present. No nasal deformity, septal deviation or mucosal edema.     Right Turbinates: Enlarged, swollen and pale.     Left Turbinates: Enlarged, swollen and pale.     Right Sinus: No maxillary sinus tenderness or frontal sinus tenderness.     Left Sinus: No maxillary sinus tenderness or frontal sinus tenderness.     Mouth/Throat:     Mouth: Mucous membranes are not pale and not dry.     Pharynx: Uvula midline.  Eyes:     General:        Right eye: No discharge.        Left eye: No discharge.     Conjunctiva/sclera: Conjunctivae normal.     Right eye: Right conjunctiva is not injected. No chemosis.    Left eye: Left conjunctiva is not injected. No chemosis.    Pupils: Pupils are equal, round, and reactive to light.  Cardiovascular:     Rate and Rhythm: Normal rate and regular rhythm.     Heart sounds: Normal heart sounds.  Pulmonary:     Effort: Pulmonary effort is normal. No tachypnea, accessory muscle usage or respiratory distress.     Breath sounds: Normal breath sounds. No wheezing, rhonchi or rales.  Chest:     Chest wall: No tenderness.  Abdominal:     Tenderness: There is no abdominal tenderness. There is no guarding or rebound.  Lymphadenopathy:     Head:     Right side of head: No submandibular, tonsillar or occipital adenopathy.     Left side of head: No  submandibular, tonsillar or occipital adenopathy.     Cervical: No cervical adenopathy.  Skin:    Coloration: Skin is  not pale.     Findings: No abrasion, erythema, petechiae or rash. Rash is not papular, urticarial or vesicular.  Neurological:     Mental Status: She is alert.  Psychiatric:        Behavior: Behavior is cooperative.      Diagnostic studies:    Spirometry: results normal (FEV1: 1.75/116%, FVC: 2.03/105%, FEV1/FVC: 86%).    Spirometry consistent with normal pattern.    Allergy Studies: labs sent instead          Malachi Bonds, MD [facility]

## 2023-10-08 ENCOUNTER — Encounter: Payer: Self-pay | Admitting: Allergy & Immunology

## 2023-10-10 DIAGNOSIS — Z96651 Presence of right artificial knee joint: Secondary | ICD-10-CM | POA: Diagnosis not present

## 2023-10-10 DIAGNOSIS — Z96653 Presence of artificial knee joint, bilateral: Secondary | ICD-10-CM | POA: Diagnosis not present

## 2023-10-10 DIAGNOSIS — Z96652 Presence of left artificial knee joint: Secondary | ICD-10-CM | POA: Diagnosis not present

## 2023-10-11 LAB — ALLERGENS W/COMP RFLX AREA 2
Alternaria Alternata IgE: <0.1 kU/L
Aspergillus Fumigatus IgE: <0.1 kU/L
Bermuda Grass IgE: <0.1 kU/L
Cedar, Mountain IgE: <0.1 kU/L
Cladosporium Herbarum IgE: <0.1 kU/L
Cockroach, German IgE: <0.1 kU/L
Common Silver Birch IgE: <0.1 kU/L
Cottonwood IgE: <0.1 kU/L
D Farinae IgE: <0.1 kU/L
D Pteronyssinus IgE: <0.1 kU/L
E001-IgE Cat Dander: <0.1 kU/L
E005-IgE Dog Dander: <0.1 kU/L
Elm, American IgE: <0.1 kU/L
IgE (Immunoglobulin E), Serum: 27 [IU]/mL (ref 6–495)
Johnson Grass IgE: <0.1 kU/L
Maple/Box Elder IgE: <0.1 kU/L
Mouse Urine IgE: <0.1 kU/L
Oak, White IgE: <0.1 kU/L
Pecan, Hickory IgE: <0.1 kU/L
Penicillium Chrysogen IgE: <0.1 kU/L
Pigweed, Rough IgE: <0.1 kU/L
Ragweed, Short IgE: <0.1 kU/L
Sheep Sorrel IgE Qn: <0.1 kU/L
Timothy Grass IgE: <0.1 kU/L
White Mulberry IgE: <0.1 kU/L

## 2023-10-11 LAB — CBC WITH DIFFERENTIAL/PLATELET
Basophils Absolute: 0 10*3/uL (ref 0.0–0.2)
Basos: 1 %
EOS (ABSOLUTE): 0.2 10*3/uL (ref 0.0–0.4)
Eos: 2 %
Hematocrit: 42 % (ref 34.0–46.6)
Hemoglobin: 13.3 g/dL (ref 11.1–15.9)
Immature Grans (Abs): 0 10*3/uL (ref 0.0–0.1)
Immature Granulocytes: 0 %
Lymphocytes Absolute: 1.3 10*3/uL (ref 0.7–3.1)
Lymphs: 21 %
MCH: 27.8 pg (ref 26.6–33.0)
MCHC: 31.7 g/dL (ref 31.5–35.7)
MCV: 88 fL (ref 79–97)
Monocytes Absolute: 0.3 10*3/uL (ref 0.1–0.9)
Monocytes: 5 %
Neutrophils Absolute: 4.4 10*3/uL (ref 1.4–7.0)
Neutrophils: 71 %
Platelets: 267 10*3/uL (ref 150–450)
RBC: 4.78 x10E6/uL (ref 3.77–5.28)
RDW: 13.2 % (ref 11.7–15.4)
WBC: 6.2 10*3/uL (ref 3.4–10.8)

## 2023-10-11 LAB — IGG, IGA, IGM
IgA/Immunoglobulin A, Serum: 174 mg/dL (ref 64–422)
IgG (Immunoglobin G), Serum: 976 mg/dL (ref 586–1602)
IgM (Immunoglobulin M), Srm: 236 mg/dL — ABNORMAL HIGH (ref 26–217)

## 2023-10-11 LAB — STREP PNEUMONIAE 23 SEROTYPES IGG
Pneumo Ab Type 1*: 0.3 ug/mL — ABNORMAL LOW (ref >1.3)
Pneumo Ab Type 12 (12F)*: <0.1 ug/mL — ABNORMAL LOW (ref >1.3)
Pneumo Ab Type 14*: 18.1 ug/mL (ref >1.3)
Pneumo Ab Type 17 (17F)*: 2.3 ug/mL (ref >1.3)
Pneumo Ab Type 19 (19F)*: 0.7 ug/mL — ABNORMAL LOW (ref >1.3)
Pneumo Ab Type 2*: 3.6 ug/mL (ref >1.3)
Pneumo Ab Type 20*: 3.3 ug/mL (ref >1.3)
Pneumo Ab Type 22 (22F)*: 1.1 ug/mL — ABNORMAL LOW (ref >1.3)
Pneumo Ab Type 23 (23F)*: <0.1 ug/mL — ABNORMAL LOW (ref >1.3)
Pneumo Ab Type 26 (6B)*: <0.1 ug/mL — ABNORMAL LOW (ref >1.3)
Pneumo Ab Type 3*: <0.1 ug/mL — ABNORMAL LOW (ref >1.3)
Pneumo Ab Type 34 (10A)*: 2 ug/mL (ref >1.3)
Pneumo Ab Type 4*: 0.3 ug/mL — ABNORMAL LOW (ref >1.3)
Pneumo Ab Type 43 (11A)*: 1.4 ug/mL (ref >1.3)
Pneumo Ab Type 5*: 0.7 ug/mL — ABNORMAL LOW (ref >1.3)
Pneumo Ab Type 51 (7F)*: 1.9 ug/mL (ref >1.3)
Pneumo Ab Type 54 (15B)*: 3.1 ug/mL (ref >1.3)
Pneumo Ab Type 56 (18C)*: 0.6 ug/mL — ABNORMAL LOW (ref >1.3)
Pneumo Ab Type 57 (19A)*: 2.2 ug/mL (ref >1.3)
Pneumo Ab Type 68 (9V)*: 0.7 ug/mL — ABNORMAL LOW (ref >1.3)
Pneumo Ab Type 70 (33F)*: 13.8 ug/mL (ref >1.3)
Pneumo Ab Type 8*: 0.6 ug/mL — ABNORMAL LOW (ref >1.3)
Pneumo Ab Type 9 (9N)*: 1.7 ug/mL (ref >1.3)

## 2023-10-11 LAB — COMPLEMENT, TOTAL: Compl, Total (CH50): >60 U/mL (ref >41)

## 2023-10-11 LAB — DIPHTHERIA / TETANUS ANTIBODY PANEL
Diphtheria Ab: 0.9 [IU]/mL (ref <0.10)
Tetanus Ab, IgG: 3.48 [IU]/mL (ref <0.10)

## 2023-10-18 DIAGNOSIS — Z6835 Body mass index (BMI) 35.0-35.9, adult: Secondary | ICD-10-CM | POA: Diagnosis not present

## 2023-10-18 DIAGNOSIS — E669 Obesity, unspecified: Secondary | ICD-10-CM | POA: Diagnosis not present

## 2023-10-21 DIAGNOSIS — M25531 Pain in right wrist: Secondary | ICD-10-CM | POA: Diagnosis not present

## 2023-10-21 DIAGNOSIS — G5602 Carpal tunnel syndrome, left upper limb: Secondary | ICD-10-CM | POA: Diagnosis not present

## 2023-11-13 DIAGNOSIS — M25562 Pain in left knee: Secondary | ICD-10-CM | POA: Diagnosis not present

## 2023-11-13 DIAGNOSIS — M25561 Pain in right knee: Secondary | ICD-10-CM | POA: Diagnosis not present

## 2023-11-17 DIAGNOSIS — Z6836 Body mass index (BMI) 36.0-36.9, adult: Secondary | ICD-10-CM | POA: Diagnosis not present

## 2023-11-17 DIAGNOSIS — E669 Obesity, unspecified: Secondary | ICD-10-CM | POA: Diagnosis not present

## 2023-11-22 ENCOUNTER — Emergency Department (HOSPITAL_BASED_OUTPATIENT_CLINIC_OR_DEPARTMENT_OTHER)
Admission: EM | Admit: 2023-11-22 | Discharge: 2023-11-22 | Disposition: A | Discharge status: Home / Self Care | Payer: Medicare PPO | Attending: Emergency Medicine | Admitting: Emergency Medicine

## 2023-11-22 ENCOUNTER — Emergency Department (HOSPITAL_BASED_OUTPATIENT_CLINIC_OR_DEPARTMENT_OTHER): Payer: Medicare PPO | Admitting: Radiology

## 2023-11-22 ENCOUNTER — Encounter (HOSPITAL_BASED_OUTPATIENT_CLINIC_OR_DEPARTMENT_OTHER): Payer: Self-pay | Admitting: Urology

## 2023-11-22 DIAGNOSIS — I700 Atherosclerosis of aorta: Secondary | ICD-10-CM | POA: Diagnosis not present

## 2023-11-22 DIAGNOSIS — G5602 Carpal tunnel syndrome, left upper limb: Secondary | ICD-10-CM | POA: Diagnosis not present

## 2023-11-22 DIAGNOSIS — Z79899 Other long term (current) drug therapy: Secondary | ICD-10-CM | POA: Insufficient documentation

## 2023-11-22 DIAGNOSIS — R051 Acute cough: Secondary | ICD-10-CM | POA: Diagnosis not present

## 2023-11-22 DIAGNOSIS — J45909 Unspecified asthma, uncomplicated: Secondary | ICD-10-CM | POA: Insufficient documentation

## 2023-11-22 DIAGNOSIS — E119 Type 2 diabetes mellitus without complications: Secondary | ICD-10-CM | POA: Diagnosis not present

## 2023-11-22 DIAGNOSIS — Z7951 Long term (current) use of inhaled steroids: Secondary | ICD-10-CM | POA: Diagnosis not present

## 2023-11-22 DIAGNOSIS — R059 Cough, unspecified: Secondary | ICD-10-CM | POA: Diagnosis not present

## 2023-11-22 DIAGNOSIS — Z20822 Contact with and (suspected) exposure to covid-19: Secondary | ICD-10-CM | POA: Diagnosis not present

## 2023-11-22 DIAGNOSIS — Z7984 Long term (current) use of oral hypoglycemic drugs: Secondary | ICD-10-CM | POA: Insufficient documentation

## 2023-11-22 DIAGNOSIS — R062 Wheezing: Secondary | ICD-10-CM | POA: Diagnosis not present

## 2023-11-22 DIAGNOSIS — M25532 Pain in left wrist: Secondary | ICD-10-CM | POA: Diagnosis present

## 2023-11-22 DIAGNOSIS — I10 Essential (primary) hypertension: Secondary | ICD-10-CM | POA: Insufficient documentation

## 2023-11-22 DIAGNOSIS — I517 Cardiomegaly: Secondary | ICD-10-CM | POA: Diagnosis not present

## 2023-11-22 LAB — RESP PANEL BY RT-PCR (RSV, FLU A&B, COVID)  RVPGX2
Influenza A by PCR: NEGATIVE
Influenza B by PCR: NEGATIVE
Resp Syncytial Virus by PCR: NEGATIVE
SARS Coronavirus 2 by RT PCR: NEGATIVE

## 2023-11-22 MED ORDER — DEXAMETHASONE SODIUM PHOSPHATE 10 MG/ML IJ SOLN
10.0000 mg | Freq: Once | INTRAMUSCULAR | Status: AC
  Administered 2023-11-22: 10 mg via INTRAMUSCULAR
  Filled 2023-11-22: qty 1

## 2023-11-22 MED ORDER — GUAIFENESIN 100 MG/5ML PO LIQD: 100.0000 mg | ORAL | 0 refills | Status: DC | PRN

## 2023-11-22 MED ORDER — METHYLPREDNISOLONE 4 MG PO TBPK: ORAL_TABLET | ORAL | 0 refills | Status: DC

## 2023-11-22 NOTE — ED Notes (Signed)
 Patient transported to X-ray

## 2023-11-22 NOTE — ED Notes (Signed)
 Pt via pov from home with recurrent left wrist pain; she states she had an injection in her wrist and put a brace on it. She states it got better, but that she is having pain again. Pt states she was having pain so severe last night that she couldn't sleep; also had swollen and numb fingers. Pt alert & oriented, nad noted.

## 2023-11-22 NOTE — Discharge Instructions (Addendum)
 You were seen in the emergency department today for left hand/wrist pain and cough.  Your flu, COVID, and RSV testing was negative.  Your chest x-ray did not look suspicious for pneumonia.  I have sent a dose of steroids and cough medicine to your pharmacy.  I recommend following up with your hand specialist regarding your carpal tunnel.  You can continue taking meloxicam as previously prescribed for pain. Continue to monitor how you're doing and return to the ER for new or worsening symptoms such as pain in your chest, difficulty breathing, or persistent fever despite medication.

## 2023-11-22 NOTE — ED Triage Notes (Signed)
 Pt states carpule tunnel flare up last night, states pain to left hand and wrist, with swelling  Has brace on at triage  Has f/o on 1/14 with pcp about same

## 2023-11-22 NOTE — ED Provider Notes (Signed)
 [place] EMERGENCY DEPARTMENT AT [facility] Provider Note   CSN: [id number] Arrival date & time: 11/22/23  1023     History  Chief Complaint  Patient presents with   Hand Problem    ASANI DENISTON is a 78 y.o. female with history of hypertension, diabetes, heart murmur, asthma, carpal tunnel the left wrist, who presents the emergency department complaining of recurrent left wrist pain.  Patient states that she has been trying to wear a brace on it, but feels that she had a flare of her symptoms last night.  She was having difficulty sleeping due to pain.  Pain radiates from her left wrist up her forearm.  She feels her 1st through 4th fingers have been intermittently numb as well.  She has a follow-up with her PCP scheduled for 1/14.  Patient also notes she has had a cough for about 5 days, productive with yellow sputum.  Intermittently wheezing and needing to use her inhaler.  No fever, but did have some chills.  HPI     Home Medications Prior to Admission medications   Medication Sig Start Date End Date Taking? Authorizing Provider  guaiFENesin  (ROBITUSSIN) 100 MG/5ML liquid Take 5-10 mLs (100-200 mg total) by mouth every 4 (four) hours as needed for cough or to loosen phlegm. 11/22/23  Yes Xavious Sharrar T, PA-C  methylPREDNISolone  (MEDROL  DOSEPAK) 4 MG TBPK tablet Take per package instructions 11/22/23  Yes Rainn Zupko T, PA-C  albuterol  (PROVENTIL  HFA;VENTOLIN  HFA) 108 (90 Base) MCG/ACT inhaler Inhale 1-2 puffs into the lungs every 6 (six) hours as needed for wheezing or shortness of breath.    [provider]  amLODipine  (NORVASC ) 5 MG tablet Take 1 tablet (5 mg total) by mouth daily. 01/24/23   Hobart Powell BRAVO, MD  azelastine  (ASTELIN ) 0.1 % nasal spray Place 1 spray into both nostrils 2 (two) times daily as needed for rhinitis. Use in each nostril as directed 10/07/23 11/06/23  Iva Marty Saltness, MD  Calcium  Carbonate Antacid (CALCIUM   CARBONATE PO) Take 1 capsule by mouth daily.    [provider]  chlorthalidone  (HYGROTON ) 25 MG tablet Take 25 mg by mouth daily. 08/16/19   [provider]  Cholecalciferol (VITAMIN D3 PO) Take 1 tablet by mouth daily.    [provider]  diclofenac Sodium (VOLTAREN) 1 % GEL Use topically as directed for pain for 3 to 7 days as needed.Do not apply to open wounds/ infections/exfoliative dermatitis. For joints only 03/10/23   [provider]  ENTRESTO  24-26 MG Take 1 tablet by mouth 2 (two) times daily. 10/01/19   [provider]  EPINEPHrine  0.3 mg/0.3 mL IJ SOAJ injection Inject 0.3 mg into the muscle as needed for anaphylaxis.    [provider]  estradiol  (ESTRACE ) 0.1 MG/GM vaginal cream Insert 0.5 grams 3 times per week. 10/15/21   Corene Coy, MD  FARXIGA  10 MG TABS tablet Take 10 mg by mouth daily. 08/22/22   [provider]  fexofenadine (ALLEGRA) 180 MG tablet Take 180 mg by mouth daily.    [provider]  fluticasone  (FLONASE ) 50 MCG/ACT nasal spray Place 2 sprays into both nostrils daily.    [provider]  glimepiride  (AMARYL ) 2 MG tablet Take 1 mg by mouth daily with breakfast.    [provider]  glucose blood (ONETOUCH VERIO) test strip  11/17/16   [provider]  metFORMIN  (GLUCOPHAGE ) 500 MG tablet Take 500 mg by mouth 2 (two) times daily with  a meal. 07/06/16   [provider]  methocarbamol  (ROBAXIN ) 500 MG tablet Take 1 tablet (500 mg total) by mouth every 6 (six) hours as needed for muscle spasms. 10/15/22   Edmisten, Kristie L, PA  montelukast  (SINGULAIR ) 10 MG tablet Take 10 mg by mouth daily.    [provider]  olopatadine  (PATADAY ) 0.1 % ophthalmic solution Place 1 drop into both eyes daily.    [provider]  rosuvastatin  (CRESTOR ) 5 MG tablet Take 1 tablet (5 mg total) by mouth daily. 10/15/22   Claudene Victory ORN, MD  Semaglutide, 2  MG/DOSE, (OZEMPIC, 2 MG/DOSE,) 8 MG/3ML SOPN Inject 2 mg into the skin every Saturday.    [provider]      Allergies    Betadine [povidone iodine], Bupivacaine , Chloroprocaine, Etidocaine, Lidocaine , Mepivacaine, Other, Prilocaine, Procaine, Tetracaine, Gadolinium derivatives, Codeine, and Demerol     Review of Systems   Review of Systems  Constitutional:  Positive for chills.  HENT:  Positive for congestion.   Respiratory:  Positive for cough and wheezing.   Musculoskeletal:  Positive for arthralgias.  All other systems reviewed and are negative.   Physical Exam Updated Vital Signs BP 138/69 (BP Location: Right Arm)   Pulse (!) 59   Temp 97.9 F (36.6 C)   Resp 18   Ht 5' (1.524 m)   Wt 87.4 kg   SpO2 98%   BMI 37.63 kg/m  Physical Exam Vitals and nursing note reviewed.  Constitutional:      Appearance: Normal appearance.  HENT:     Head: Normocephalic and atraumatic.  Eyes:     Conjunctiva/sclera: Conjunctivae normal.  Cardiovascular:     Rate and Rhythm: Normal rate and regular rhythm.  Pulmonary:     Effort: Pulmonary effort is normal. No respiratory distress.     Breath sounds: Normal breath sounds.  Abdominal:     General: There is no distension.     Palpations: Abdomen is soft.     Tenderness: There is no abdominal tenderness.  Musculoskeletal:     Comments: Bilateral wrists in braces, some decreased sensation in the left 1st-4th digits, normal ROM of the digits, normal capillary refill  Skin:    General: Skin is warm and dry.  Neurological:     General: No focal deficit present.     Mental Status: She is alert.     ED Results / Procedures / Treatments   Labs (all labs ordered are listed, but only abnormal results are displayed) Labs Reviewed  RESP PANEL BY RT-PCR (RSV, FLU A&B, COVID)  [id number]    EKG None  Radiology DG Chest 2 View Result Date: 11/22/2023 CLINICAL DATA:  Cough for 5 days and wheezing. EXAM: CHEST - 2 VIEW COMPARISON:   06/16/2020 FINDINGS: Mild cardiac enlargement. Aortic atherosclerotic calcifications. No pleural fluid, interstitial edema, or airspace disease. The visualized osseous structures are unremarkable. IMPRESSION: 1. No acute cardiopulmonary disease. 2. Mild cardiac enlargement. Electronically Signed   By: Waddell Calk M.D.   On: 11/22/2023 13:14    Procedures Procedures    Medications Ordered in ED Medications  dexamethasone  (DECADRON ) injection 10 mg (10 mg Intramuscular Given 11/22/23 1251)    ED Course/ Medical Decision Making/ A&P                                 Medical Decision Making Amount and/or Complexity of Data Reviewed Radiology: ordered.  Risk OTC  drugs. Prescription drug management.   This patient is a 78 y.o. female  who presents to the ED for concern of L wrist pain and cough.   Differential diagnoses prior to evaluation: The emergent differential diagnosis includes, but is not limited to,  carpal tunnel, fracture, dislocation, ligamentous injury, septic joint, sepsis, viral illness, URI, pneumonia, asthma exacerbation. This is not an exhaustive differential.   Past Medical History / Co-morbidities / Social History: hypertension, diabetes, heart murmur, asthma, carpal tunnel the left wrist  Additional history: Chart reviewed. Pertinent results include: Reviewed ER visit note for same symptoms on 10/25, patient received Decadron  at that time, and said it was helpful  Physical Exam: Physical exam performed. The pertinent findings include: Normal vital signs, no acute distress.  Lung sounds clear, heart regular rate and rhythm.  Lab Tests/Imaging studies: I personally interpreted labs/imaging and the pertinent results include: Respiratory panel negative.  Chest x-ray without acute abnormalities.. I agree with the radiologist interpretation.  Medications: I ordered medication including Decadron .  I have reviewed the patients home medicines and have made adjustments  as needed.   Disposition: After consideration of the diagnostic results and the patients response to treatment, I feel that emergency department workup does not suggest an emergent condition requiring admission or immediate intervention beyond what has been performed at this time. The plan is: Discharged to home with ongoing treatment of likely carpal tunnel of the left wrist, and likely viral respiratory infection.  No evidence of pneumonia on chest x-ray.  Will send Medrol  Dosepak and cough medication, and recommend patient continue her inhalers for asthma and wheezing. The patient is safe for discharge and has been instructed to return immediately for worsening symptoms, change in symptoms or any other concerns.  Final Clinical Impression(s) / ED Diagnoses Final diagnoses:  Carpal tunnel syndrome of left wrist  Acute cough    Rx / DC Orders ED Discharge Orders          Ordered    methylPREDNISolone  (MEDROL  DOSEPAK) 4 MG TBPK tablet        11/22/23 1240    guaiFENesin  (ROBITUSSIN) 100 MG/5ML liquid  Every 4 hours PRN        11/22/23 1240           Portions of this report may have been transcribed using voice recognition software. Every effort was made to ensure accuracy; however, inadvertent computerized transcription errors may be present.    Corie Friddie ONEIDA DEVONNA 11/22/23 1353    Lenor Hollering, MD 11/22/23 1435

## 2023-11-26 DIAGNOSIS — M25562 Pain in left knee: Secondary | ICD-10-CM | POA: Diagnosis not present

## 2023-11-26 DIAGNOSIS — M25561 Pain in right knee: Secondary | ICD-10-CM | POA: Diagnosis not present

## 2023-11-27 DIAGNOSIS — Z96653 Presence of artificial knee joint, bilateral: Secondary | ICD-10-CM | POA: Diagnosis not present

## 2023-11-27 DIAGNOSIS — Z471 Aftercare following joint replacement surgery: Secondary | ICD-10-CM | POA: Diagnosis not present

## 2023-11-28 ENCOUNTER — Ambulatory Visit (HOSPITAL_BASED_OUTPATIENT_CLINIC_OR_DEPARTMENT_OTHER): Payer: Medicare PPO | Admitting: Cardiology

## 2023-11-28 DIAGNOSIS — M25562 Pain in left knee: Secondary | ICD-10-CM | POA: Diagnosis not present

## 2023-11-28 DIAGNOSIS — M25561 Pain in right knee: Secondary | ICD-10-CM | POA: Diagnosis not present

## 2023-12-01 DIAGNOSIS — E1169 Type 2 diabetes mellitus with other specified complication: Secondary | ICD-10-CM | POA: Diagnosis not present

## 2023-12-01 DIAGNOSIS — N1831 Chronic kidney disease, stage 3a: Secondary | ICD-10-CM | POA: Diagnosis not present

## 2023-12-01 DIAGNOSIS — I129 Hypertensive chronic kidney disease with stage 1 through stage 4 chronic kidney disease, or unspecified chronic kidney disease: Secondary | ICD-10-CM | POA: Diagnosis not present

## 2023-12-02 DIAGNOSIS — G5602 Carpal tunnel syndrome, left upper limb: Secondary | ICD-10-CM | POA: Diagnosis not present

## 2023-12-03 DIAGNOSIS — M25562 Pain in left knee: Secondary | ICD-10-CM | POA: Diagnosis not present

## 2023-12-03 DIAGNOSIS — M25561 Pain in right knee: Secondary | ICD-10-CM | POA: Diagnosis not present

## 2023-12-05 DIAGNOSIS — M25561 Pain in right knee: Secondary | ICD-10-CM | POA: Diagnosis not present

## 2023-12-05 DIAGNOSIS — M25562 Pain in left knee: Secondary | ICD-10-CM | POA: Diagnosis not present

## 2023-12-08 ENCOUNTER — Ambulatory Visit (HOSPITAL_BASED_OUTPATIENT_CLINIC_OR_DEPARTMENT_OTHER): Payer: Medicare PPO | Admitting: Cardiology

## 2023-12-08 VITALS — BP 110/70 | HR 57 | Ht 61.0 in | Wt 192.0 lb

## 2023-12-08 DIAGNOSIS — I10 Essential (primary) hypertension: Secondary | ICD-10-CM

## 2023-12-08 DIAGNOSIS — R001 Bradycardia, unspecified: Secondary | ICD-10-CM

## 2023-12-08 DIAGNOSIS — E785 Hyperlipidemia, unspecified: Secondary | ICD-10-CM

## 2023-12-08 NOTE — Progress Notes (Signed)
Cardiology Office Note:  .   Date:  12/08/2023  ID:  Cheryl Good, DOB July 22, 1946, MRN [id number] PCP: Renaye Rakers, MD  [facility] Providers Cardiologist:  Donato Schultz, MD     History of Present Illness: .   Cheryl Good is a 78 y.o. female Discussed with the use of AI scribe   History of Present Illness   The patient, a 78 year old female with a history of hypertension, diabetes, bradycardia, and hyperlipidemia, presents for a routine follow-up. Over the past few years, the patient has noticed bradycardia, which is not associated with any symptoms of cerebral hypoperfusion or exertional fatigue. There have been no reported palpitations. The patient's current medication regimen includes Crestor and [place]. The most recent LDL level was 35. A prior EKG showed sinus bradycardia at 58 beats per minute with a short PR interval. An echocardiogram from 09/20/22 was normal with an ejection fraction of 65%, normal diastolic parameters, and trivial mitral regurgitation. A prior exercise treadmill test from 10/02/18 was negative for ischemia. A Holter monitor from 10/02/18 showed a heart rate ranging from 44 to 100 with occasional PACs and PVCs.  The patient recently recovered from bronchitis and sinusitis that began at the end of December. The patient still experiences congestion. The patient also reports having allergies. The patient is scheduled to have surgery for carpal tunnel syndrome at the end of February. The patient has recently retired and has started attending a gym for [employer] and [employer]. The patient is also considering hiring a Systems analyst.   Former Dr. Katrinka Blazing patient, also on the [facility] board with him. 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           ROS: No CP, No SOB  Studies Reviewed: Marland Kitchen   EKG Interpretation Date/Time:  Monday December 08 2023 10:41:21 EST Ventricular  Rate:  57 PR Interval:  142 QRS Duration:  74 QT Interval:  398 QTC Calculation: 387 R Axis:   -24  Text Interpretation: Sinus bradycardia Nonspecific T wave abnormality When compared with ECG of 04-Nov-2017 15:12, No significant change since last tracing Confirmed by Donato Schultz ([id number]) on 12/08/2023 11:00:26 AM    Results   LABS LDL: 35  DIAGNOSTIC EKG: Sinus bradycardia, 58 bpm, short PR interval, possible junctional tachycardia with competing pacemaker Echocardiogram: Normal, ejection fraction 65%, normal diastolic parameters, trivial mitral regurgitation (09/20/2022) Exercise treadmill: Negative for ischemia (10/02/2018) Holter monitor: Heart rate 44-100 bpm, occasional PACs and PVCs (10/02/2018)     Risk Assessment/Calculations:            Physical Exam:   VS:  BP 110/70 (BP Location: Right Arm, Patient Position: Sitting, Cuff Size: Normal)   Pulse (!) 57 Comment: HR is 65 with pulse oxy  Ht 5\' 1"  (1.549 m)   Wt 192 lb (87.1 kg)   SpO2 97%   BMI 36.28 kg/m    Wt Readings from Last 3 Encounters:  12/08/23 192 lb (87.1 kg)  11/22/23 192 lb 10.9 oz (87.4 kg)  10/07/23 192 lb 11.2 oz (87.4 kg)    GEN: Well nourished, well developed in no acute distress NECK: No JVD; No carotid bruits CARDIAC: RRR, no murmurs, no rubs, no gallops RESPIRATORY:  Clear to auscultation without rales, wheezing or rhonchi  ABDOMEN: Soft, non-tender, non-distended EXTREMITIES:  No edema; No deformity   ASSESSMENT AND PLAN: .    Assessment and Plan  Bradycardia Chronic bradycardia with no symptoms of cerebral hypoperfusion or exertional fatigue. Prior EKG showed sinus bradycardia at 58 bpm with a short PR interval. Echocardiogram and exercise treadmill test were normal. Holter monitor showed heart rate from 44 to 100 bpm with occasional PACs and PVCs. Bradycardia is considered benign at this time. Discussed potential need for a pacemaker if symptomatic bradycardia occurs. - Order annual  EKG to monitor heart rate - Advise reporting new symptoms such as extreme fatigue or unusual exertional dyspnea  Hypertension Well-controlled hypertension on Norvasc (amlodipine). - Continue current antihypertensive regimen  Diabetes Mellitus Type 2 diabetes mellitus managed with [place]. - Continue current diabetes management regimen  Hyperlipidemia Hyperlipidemia well-controlled with Crestor (rosuvastatin). LDL was 35. - Continue current lipid-lowering therapy  General Health Maintenance Engaging in physical activities, plans to join water aerobics and work with a Systems analyst. - Encourage continued physical activity and healthy lifestyle  Follow-up - Schedule next follow-up visit in one year - Order annual EKG.               Signed, Donato Schultz, MD

## 2023-12-08 NOTE — Patient Instructions (Signed)
Medication Instructions:  Your physician recommends that you continue on your current medications as directed. Please refer to the Current Medication list given to you today.  *If you need a refill on your cardiac medications before your next appointment, please call your pharmacy*  Lab Work: NONE  Testing/Procedures: NONE  Follow-Up: At [facility], you and your health needs are our priority.  As part of our continuing mission to provide you with exceptional heart care, we have created designated Provider Care Teams.  These Care Teams include your primary Cardiologist (physician) and Advanced Practice Providers (APPs -  Physician Assistants and Nurse Practitioners) who all work together to provide you with the care you need, when you need it.  We recommend signing up for the patient portal called "MyChart".  Sign up information is provided on this After Visit Summary.  MyChart is used to connect with patients for Virtual Visits (Telemedicine).  Patients are able to view lab/test results, encounter notes, upcoming appointments, etc.  Non-urgent messages can be sent to your provider as well.   To learn more about what you can do with MyChart, go to [email or web address].    Your next appointment:   12 month(s)  The format for your next appointment:   In Person  Provider:   Dr Anne Fu

## 2023-12-12 DIAGNOSIS — E1165 Type 2 diabetes mellitus with hyperglycemia: Secondary | ICD-10-CM | POA: Diagnosis not present

## 2023-12-12 DIAGNOSIS — M25562 Pain in left knee: Secondary | ICD-10-CM | POA: Diagnosis not present

## 2023-12-12 DIAGNOSIS — M25561 Pain in right knee: Secondary | ICD-10-CM | POA: Diagnosis not present

## 2023-12-15 DIAGNOSIS — M25561 Pain in right knee: Secondary | ICD-10-CM | POA: Diagnosis not present

## 2023-12-15 DIAGNOSIS — M25562 Pain in left knee: Secondary | ICD-10-CM | POA: Diagnosis not present

## 2023-12-16 ENCOUNTER — Encounter: Payer: Self-pay | Admitting: Allergy & Immunology

## 2023-12-16 ENCOUNTER — Ambulatory Visit: Payer: Medicare PPO | Admitting: Allergy & Immunology

## 2023-12-16 ENCOUNTER — Other Ambulatory Visit: Payer: Self-pay

## 2023-12-16 VITALS — BP 114/60 | HR 87 | Temp 97.5°F | Resp 12

## 2023-12-16 DIAGNOSIS — B999 Unspecified infectious disease: Secondary | ICD-10-CM | POA: Diagnosis not present

## 2023-12-16 DIAGNOSIS — L2089 Other atopic dermatitis: Secondary | ICD-10-CM | POA: Diagnosis not present

## 2023-12-16 DIAGNOSIS — J310 Chronic rhinitis: Secondary | ICD-10-CM | POA: Diagnosis not present

## 2023-12-16 DIAGNOSIS — J4530 Mild persistent asthma, uncomplicated: Secondary | ICD-10-CM

## 2023-12-16 MED ORDER — CEFDINIR 300 MG PO CAPS: 300.0000 mg | ORAL_CAPSULE | Freq: Two times a day (BID) | ORAL | 0 refills | Status: AC

## 2023-12-16 NOTE — Progress Notes (Signed)
FOLLOW UP  Date of Service/Encounter:  12/16/23   Assessment:   Mild persistent asthma, uncomplicated   Chronic non-allergic rhinitis   Acute sinusitis - failed amoxicillin (adding cefdinir instead)   Recurrent infections - with lackluster protection against Streptococcus pneumonia despite pneumonia vaccine in calendar year 2024, consider prophylactic antibiotics if no improvement   Flexural atopic dermatitis - followed by Dermatology (Dr. 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 Mild persistent asthma, uncomplicated - Lung testing looked excellent today. - I think that can hold off on prednisone since your levels were over 100%.  - Start the [place] sample provided and use this two puffs twice daily until the sample is out.  - Daily controller medication(s): NOTHING - Prior to physical activity: albuterol 2 puffs 10-15 minutes before physical activity. - Rescue medications: albuterol 4 puffs every 4-6 hours as needed - Asthma control goals:  * Full participation in all desired activities (may need albuterol before activity) * Albuterol use two time or less a week on average (not counting use with activity) * Cough interfering with sleep two time or less a month * Oral steroids no more than once a year * No hospitalizations  2. Chronic rhinitis - with overlying sinusitis - Testing was negative via the blood, so clearly the allergy shots helped. - We can do more sensitive skin testing if you are interested in pursuing this.  - We are going to treat you with cefdinir 300mg  twice daily  3. Recurrent infections - with protection to only 11/23 strains of Streptococcus pneumonia (despite having received the pneumonia vaccine)  - I think we can hold off  on further workup for now, but if the frequency of infections gets worse, we can be more aggressive.  - We can think about doing a prophylactic antibiotic if the frequency of infections increases in 2025.   4. Eczema - Continue with the compounded eczema cream that you are using. - OK to use over the counter hydrocortisone on your face if needed.   - Continue to follow with Dr. Margo Aye.   5. Return in about 6 months (around 06/14/2024). You can have the follow up appointment with Dr. Dellis Anes or a Nurse Practicioner (our Nurse Practitioners are excellent and always have Physician oversight!).   Subjective:   Cheryl Good is a 78 y.o. 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  Discussed the use of AI scribe software for clinical note transcription with the patient and/or guardian, who gave verbal consent to proceed.  Cheryl Good is a 78 y.o. female presenting for a follow up visit.  She was last seen in November 2024.  At that time, her lung testing looked good.  We continue with albuterol as needed.  For her rhinitis, we got environmental allergy testing via the  blood.  We added on Astelin on particularly bad days and continue with Allegra and Flonase daily.  For her recurrent infections, we obtained lab work to evaluate her immune system.  Eczema was controlled with a compounded eczema cream as well as over-the-counter hydrocortisone.  Her immune workup was largely normal.  She was reluctant to 11 out of 23 strains of Streptococcus pneumonia which is mediocre.  Everything else was  completely normal.  Since the last visit, she has done well.   Asthma/Respiratory Symptom History: Cheryl Good presents with persistent cough and respiratory symptoms. She a rattling productive cough on December 31st. She has had copious yellow mucous pouring out of her nose. She has been in and out of her bed and feeling very sick. She has been using Mucinex at home. She also had some amoxicillin that was leftover from her dentist. She apparently has to premedicate before dental procedures. She had a 7 day course with transient, although not complete, improvement. She given Norel-D and she had a CXR that was negative. She did get prednisone for inflammation of her wrist at some point. It did help with her sinuses. Her asthma is usually well-controlled without daily medication. She has been using an albuterol inhaler, taking two puffs as needed, which provides relief.   Allergic Rhinitis Symptom History: She has been using her Flonase. She did not try the Astelin. Her asthma is usually well-controlled without daily medication. She has been using an albuterol inhaler, taking two puffs as needed, which provides relief.      She is planning to travel to [place] for a year-long work program requiring travel twice. She has lived in [place] for two years and frequently visited the city before COVID-19.   Otherwise, there have been no changes to her past medical history, surgical history, family history, or social history.    Review of systems otherwise negative other than that mentioned in the HPI.    Objective:   Blood pressure 114/60, pulse 87, temperature (!) 97.5 F (36.4 C), temperature source Temporal, resp. rate 12, SpO2 98%. There is no height or weight on file to calculate BMI.    Physical Exam Vitals reviewed.  Constitutional:      Appearance: She is well-developed.  HENT:     Head: Normocephalic and atraumatic.     Right Ear: Tympanic membrane, ear canal and external ear normal. No  drainage, swelling or tenderness. Tympanic membrane is not injected, scarred, erythematous, retracted or bulging.     Left Ear: Tympanic membrane, ear canal and external ear normal. No drainage, swelling or tenderness. Tympanic membrane is not injected, scarred, erythematous, retracted or bulging.     Nose: Rhinorrhea present. No nasal deformity, septal deviation or mucosal edema.     Right Turbinates: Enlarged, swollen and pale.     Left Turbinates: Enlarged, swollen and pale.     Right Sinus: No maxillary sinus tenderness or frontal sinus tenderness.     Left Sinus: No maxillary sinus tenderness or frontal sinus tenderness.     Comments: Turbinates are erythematous bilaterally.    Mouth/Throat:     Mouth: Mucous membranes are not pale and not dry.     Pharynx: Uvula midline.  Eyes:     General:        Right eye: No discharge.        Left eye: No discharge.     Conjunctiva/sclera: Conjunctivae normal.     Right eye: Right conjunctiva is not injected. No chemosis.  Left eye: Left conjunctiva is not injected. No chemosis.    Pupils: Pupils are equal, round, and reactive to light.  Cardiovascular:     Rate and Rhythm: Normal rate and regular rhythm.     Heart sounds: Normal heart sounds.  Pulmonary:     Effort: Pulmonary effort is normal. No tachypnea, accessory muscle usage or respiratory distress.     Breath sounds: Normal breath sounds. No wheezing, rhonchi or rales.     Comments: Very hoarse. Chest:     Chest wall: No tenderness.  Lymphadenopathy:     Head:     Right side of head: No submandibular, tonsillar or occipital adenopathy.     Left side of head: No submandibular, tonsillar or occipital adenopathy.     Cervical: No cervical adenopathy.  Skin:    Coloration: Skin is not pale.     Findings: No abrasion, erythema, petechiae or rash. Rash is not papular, urticarial or vesicular.  Neurological:     Mental Status: She is alert.  Psychiatric:        Behavior: Behavior is  cooperative.      Diagnostic studies:    Spirometry: results normal (FEV1: 1.84/123%, FVC: 2.39/123%, FEV1/FVC: 77%).    Spirometry consistent with normal pattern.   Allergy Studies: none       Malachi Bonds, MD  [facility]

## 2023-12-16 NOTE — Patient Instructions (Addendum)
1. Mild persistent asthma, uncomplicated - Lung testing looked excellent today. - I think that can hold off on prednisone since your levels were over 100%.  - Start the [place] sample provided and use this two puffs twice daily until the sample is out.  - Daily controller medication(s): NOTHING - Prior to physical activity: albuterol 2 puffs 10-15 minutes before physical activity. - Rescue medications: albuterol 4 puffs every 4-6 hours as needed - Asthma control goals:  * Full participation in all desired activities (may need albuterol before activity) * Albuterol use two time or less a week on average (not counting use with activity) * Cough interfering with sleep two time or less a month * Oral steroids no more than once a year * No hospitalizations  2. Chronic rhinitis - with overlying sinusitis - Testing was negative via the blood, so clearly the allergy shots helped. - We can do more sensitive skin testing if you are interested in pursuing this.  - We are going to treat you with cefdinir 300mg  twice daily  3. Recurrent infections - with protection to only 11/23 strains of Streptococcus pneumonia (despite having received the pneumonia vaccine)  - I think we can hold off on further workup for now, but if the frequency of infections gets worse, we can be more aggressive.  - We can think about doing a prophylactic antibiotic if the frequency of infections increases in 2025.   4. Eczema - Continue with the compounded eczema cream that you are using. - OK to use over the counter hydrocortisone on your face if needed.   - Continue to follow with Dr. Margo Aye.   5. Return in about 6 months (around 06/14/2024). You can have the follow up appointment with Dr. Dellis Anes or a Nurse Practicioner (our Nurse Practitioners are excellent and always have Physician oversight!).    Please inform us of any Emergency Department visits, hospitalizations, or changes in symptoms. Call us before going to the ED  for breathing or allergy symptoms since we might be able to fit you in for a sick visit. Feel free to contact us anytime with any questions, problems, or concerns.  It was a pleasure to see you again today!  Websites that have reliable patient information: 1. American Academy of Asthma, Allergy, and Immunology: www.aaaai.org 2. Food Allergy Research and Education (FARE): foodallergy.org 3. Mothers of Asthmatics: http://www.asthmacommunitynetwork.org 4. American College of Allergy, Asthma, and Immunology: www.acaai.org      "Like" [place] on Facebook and Instagram for our latest updates!      A healthy democracy works best when [employer] participate! Make sure you are registered to vote! If you have moved or changed any of your contact information, you will need to get this updated before voting! Scan the QR codes below to learn more!

## 2023-12-17 DIAGNOSIS — M25561 Pain in right knee: Secondary | ICD-10-CM | POA: Diagnosis not present

## 2023-12-17 DIAGNOSIS — M25562 Pain in left knee: Secondary | ICD-10-CM | POA: Diagnosis not present

## 2023-12-18 DIAGNOSIS — Z6835 Body mass index (BMI) 35.0-35.9, adult: Secondary | ICD-10-CM | POA: Diagnosis not present

## 2023-12-18 DIAGNOSIS — E669 Obesity, unspecified: Secondary | ICD-10-CM | POA: Diagnosis not present

## 2023-12-19 DIAGNOSIS — Z1231 Encounter for screening mammogram for malignant neoplasm of breast: Secondary | ICD-10-CM | POA: Diagnosis not present

## 2023-12-22 DIAGNOSIS — M25561 Pain in right knee: Secondary | ICD-10-CM | POA: Diagnosis not present

## 2023-12-22 DIAGNOSIS — M25562 Pain in left knee: Secondary | ICD-10-CM | POA: Diagnosis not present

## 2023-12-24 DIAGNOSIS — M25561 Pain in right knee: Secondary | ICD-10-CM | POA: Diagnosis not present

## 2023-12-24 DIAGNOSIS — M25562 Pain in left knee: Secondary | ICD-10-CM | POA: Diagnosis not present

## 2024-01-06 DIAGNOSIS — G5602 Carpal tunnel syndrome, left upper limb: Secondary | ICD-10-CM | POA: Diagnosis not present

## 2024-01-14 DIAGNOSIS — G5602 Carpal tunnel syndrome, left upper limb: Secondary | ICD-10-CM | POA: Diagnosis not present

## 2024-01-23 ENCOUNTER — Telehealth: Payer: Self-pay

## 2024-01-23 NOTE — Telephone Encounter (Signed)
 Patient states the sample of Cheryl Good worked really well for her. Patient uses [facility].

## 2024-02-18 ENCOUNTER — Telehealth: Payer: Self-pay

## 2024-02-18 ENCOUNTER — Other Ambulatory Visit: Payer: Self-pay

## 2024-02-18 ENCOUNTER — Telehealth: Payer: Self-pay | Admitting: Allergy & Immunology

## 2024-02-18 MED ORDER — BREZTRI AEROSPHERE 160-9-4.8 MCG/ACT IN AERO: 2.0000 | INHALATION_SPRAY | Freq: Two times a day (BID) | RESPIRATORY_TRACT | 5 refills | Status: DC

## 2024-02-18 NOTE — Telephone Encounter (Signed)
 Spoke with patient--DOB verified---informed her that [facility] [address]. Verbalized understanding.

## 2024-02-18 NOTE — Telephone Encounter (Signed)
 Patient called and stated that the sample of Cheryl Good is working very well and she would like to continue with it. [facility] [address] Patients call back number is [phone]

## 2024-02-26 DIAGNOSIS — E1169 Type 2 diabetes mellitus with other specified complication: Secondary | ICD-10-CM | POA: Diagnosis not present

## 2024-02-26 DIAGNOSIS — G5602 Carpal tunnel syndrome, left upper limb: Secondary | ICD-10-CM | POA: Diagnosis not present

## 2024-02-26 DIAGNOSIS — E785 Hyperlipidemia, unspecified: Secondary | ICD-10-CM | POA: Diagnosis not present

## 2024-02-26 DIAGNOSIS — I129 Hypertensive chronic kidney disease with stage 1 through stage 4 chronic kidney disease, or unspecified chronic kidney disease: Secondary | ICD-10-CM | POA: Diagnosis not present

## 2024-02-26 DIAGNOSIS — N1831 Chronic kidney disease, stage 3a: Secondary | ICD-10-CM | POA: Diagnosis not present

## 2024-02-27 DIAGNOSIS — R251 Tremor, unspecified: Secondary | ICD-10-CM | POA: Diagnosis not present

## 2024-02-27 DIAGNOSIS — E1169 Type 2 diabetes mellitus with other specified complication: Secondary | ICD-10-CM | POA: Diagnosis not present

## 2024-03-11 DIAGNOSIS — E1165 Type 2 diabetes mellitus with hyperglycemia: Secondary | ICD-10-CM | POA: Diagnosis not present

## 2024-04-29 DIAGNOSIS — G5602 Carpal tunnel syndrome, left upper limb: Secondary | ICD-10-CM | POA: Diagnosis not present

## 2024-04-29 DIAGNOSIS — M65351 Trigger finger, right little finger: Secondary | ICD-10-CM | POA: Diagnosis not present

## 2024-06-09 DIAGNOSIS — E1165 Type 2 diabetes mellitus with hyperglycemia: Secondary | ICD-10-CM | POA: Diagnosis not present

## 2024-06-15 ENCOUNTER — Ambulatory Visit: Payer: Medicare PPO | Admitting: Allergy & Immunology

## 2024-06-15 ENCOUNTER — Encounter: Payer: Self-pay | Admitting: Allergy & Immunology

## 2024-06-15 ENCOUNTER — Other Ambulatory Visit: Payer: Self-pay

## 2024-06-15 VITALS — BP 112/64 | HR 63 | Temp 98.1°F | Resp 16 | Ht 62.0 in | Wt 190.8 lb

## 2024-06-15 DIAGNOSIS — T782XXD Anaphylactic shock, unspecified, subsequent encounter: Secondary | ICD-10-CM | POA: Diagnosis not present

## 2024-06-15 DIAGNOSIS — B999 Unspecified infectious disease: Secondary | ICD-10-CM

## 2024-06-15 DIAGNOSIS — J310 Chronic rhinitis: Secondary | ICD-10-CM

## 2024-06-15 DIAGNOSIS — J4530 Mild persistent asthma, uncomplicated: Secondary | ICD-10-CM

## 2024-06-15 DIAGNOSIS — L2089 Other atopic dermatitis: Secondary | ICD-10-CM

## 2024-06-15 MED ORDER — MONTELUKAST SODIUM 10 MG PO TABS: 10.0000 mg | ORAL_TABLET | Freq: Every day | ORAL | 2 refills | Status: DC

## 2024-06-15 MED ORDER — BREZTRI AEROSPHERE 160-9-4.8 MCG/ACT IN AERO: 2.0000 | INHALATION_SPRAY | Freq: Two times a day (BID) | RESPIRATORY_TRACT | 5 refills | Status: DC

## 2024-06-15 MED ORDER — BREZTRI AEROSPHERE 160-9-4.8 MCG/ACT IN AERO: 2.0000 | INHALATION_SPRAY | Freq: Two times a day (BID) | RESPIRATORY_TRACT | 5 refills | Status: AC

## 2024-06-15 MED ORDER — TACROLIMUS 0.1 % EX OINT: TOPICAL_OINTMENT | CUTANEOUS | 3 refills | Status: AC

## 2024-06-15 NOTE — Patient Instructions (Addendum)
 1. Mild persistent asthma, uncomplicated - Lung testing looked excellent today. - We are not going to make any changes at this point in time.  - You have the Breztri  to use if needed. - Maybe you can just use this during the spring time.  - Daily controller medication(s): Breztri  two puffs twice daily during certain times of the year - Prior to physical activity: albuterol  2 puffs 10-15 minutes before physical activity. - Rescue medications: albuterol  4 puffs every 4-6 hours as needed - Asthma control goals:  * Full participation in all desired activities (may need albuterol  before activity) * Albuterol  use two time or less a week on average (not counting use with activity) * Cough interfering with sleep two time or less a month * Oral steroids no more than once a year * No hospitalizations  2. Chronic rhinitis - with negative blood work  - Testing was negative via the blood, so clearly the allergy shots helped. - We can do more sensitive skin testing if you are interested in pursuing this.  - In the meantime, continue with the Allegra and the Singulair . - Refills sent in for Singulair , but Allegra is typically NOT covered by insurance.   3. Recurrent infections - with protection to only 11/23 strains of Streptococcus pneumonia (despite having received the pneumonia vaccine)  - I think we can hold off on further workup for now, but if the frequency of infections gets worse, we can be more aggressive.  - We can think about doing a prophylactic antibiotic if the frequency of infections increases in 2025.  - This has definitely improved since you have been semi-retired.  4. Eczema - Add on tacrolimus  twice daily as needed (SAFE to use on the face).  - OK to use over the counter hydrocortisone on your face if needed.   - Continue to follow with Dr. Shona.   5. 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  - Neffy  is usually not covered by Medicare, but we could try sending  it in if you are interested.   6. Return in about 6 months (around 12/16/2024). You can have the follow up appointment with Dr. Iva or a Nurse Practicioner (our Nurse Practitioners are excellent and always have Physician oversight!).    Please inform us  of any Emergency Department visits, hospitalizations, or changes in symptoms. Call us  before going to the ED for breathing or allergy symptoms since we might be able to fit you in for a sick visit. Feel free to contact us  anytime with any questions, problems, or concerns.  It was a pleasure to see you again today!  Websites that have reliable patient information: 1. American Academy of Asthma, Allergy, and Immunology: www.aaaai.org 2. Food Allergy Research and Education (FARE): foodallergy.org 3. Mothers of Asthmatics: http://www.asthmacommunitynetwork.org 4. American College of Allergy, Asthma, and Immunology: www.acaai.org      "Like" us  on Facebook and Instagram for our latest updates!      A healthy democracy works best when [employer] participate! Make sure you are registered to vote! If you have moved or changed any of your contact information, you will need to get this updated before voting! Scan the QR codes below to learn more!

## 2024-06-15 NOTE — Progress Notes (Signed)
 FOLLOW UP  Date of Service/Encounter:  06/15/24   Assessment:   Mild persistent asthma, uncomplicated   Chronic non-allergic rhinitis    Acute sinusitis - failed amoxicillin (adding cefdinir  instead)   Recurrent infections - with lackluster protection against Streptococcus pneumonia despite pneumonia vaccine in calendar year 2024, consider prophylactic antibiotics if no improvement   Flexural atopic dermatitis - followed by Dermatology (Dr. 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 Mild persistent asthma, uncomplicated - Lung testing looked excellent today. - We are not going to make any changes at this point in time.  - You have the Breztri  to use if needed. - Maybe you can just use this during the spring time.  - Daily controller medication(s): Breztri  two puffs twice daily during certain times of the year - Prior to physical activity: albuterol  2 puffs 10-15 minutes before physical activity. - Rescue medications: albuterol  4 puffs every 4-6 hours as needed - Asthma control goals:  * Full participation in all desired activities (may need albuterol  before activity) * Albuterol  use two time or less a week on average (not counting use with activity) * Cough interfering with sleep two time or less a month * Oral steroids no more than once a year * No hospitalizations  2. Chronic rhinitis - with negative blood work  - Testing was negative via the blood, so clearly the allergy shots helped. - We can do more sensitive skin testing if you are interested in pursuing this.  - In the meantime, continue with the Allegra and the Singulair . - Refills sent in for Singulair , but Allegra is typically NOT covered by insurance.   3. Recurrent infections - with  protection to only 11/23 strains of Streptococcus pneumonia (despite having received the pneumonia vaccine)  - I think we can hold off on further workup for now, but if the frequency of infections gets worse, we can be more aggressive.  - We can think about doing a prophylactic antibiotic if the frequency of infections increases in 2025.  - This has definitely improved since you have been semi-retired.  4. Eczema - Add on tacrolimus  twice daily as needed (SAFE to use on the face).  - OK to use over the counter hydrocortisone on your face if needed.   - Continue to follow with Dr. Shona.   5. 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  - Neffy  is usually not covered by Medicare, but we could try sending it in if you are interested.   6. Return in about 6 months (around 12/16/2024). You can have the follow up appointment with Dr. Iva or a Nurse Practicioner (our Nurse Practitioners are excellent and always have Physician oversight!).   Subjective:   Cheryl Good is a 78 y.o. 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  Discussed the use of AI scribe software for clinical note transcription with the patient and/or guardian, who gave verbal consent to proceed.  Cheryl Good is a 78 y.o. female presenting for a follow up visit.  She was last seen in January 2025.  At that time, her lung testing looked excellent.  We started Breztri  and use 2 puffs twice daily until the sample was out.  We continue with albuterol  as needed.  For her rhinitis, testing was  negative the other blood work.  We did start her on cefdinir  300 mg twice daily for overlying sinusitis.  For her recurrent infections, she was only protective to 11 out of 23 strains of Streptococcus pneumonia.  For her eczema, we continued with her hydrocortisone.  Since last visit, she has done well.   Asthma/Respiratory Symptom History: Her asthma symptoms are generally well-controlled except during pollen season and when exposed to dust mites while traveling, particularly in hotels. She uses her medication to manage these symptoms while traveling and at home. She was given a sample of Breztri  inhaler previously, which she used during a flare-up caused by high pollen levels, but she did not find it effective. However, she stopped using it after reading it was for COPD, not asthma. She has not needed prednisone since her last visit.  Allergic Rhinitis Symptom History: For her environmental allergies, she takes Allegra and Singulair  regularly .  Food Allergy Symptom History: She is allergic to hot, spicy foods, which cause her to have respiratory reactions, and she manages these episodes with her inhaler. She has an EpiPen  for emergencies but has not needed it recently.  Skin Symptom History: She experiences eczema flare-ups, particularly on her face, which she has never had before. She has been managing it with treatments prescribed by her physician, but she is seeking something safe for use on her face.  Infection Symptom History: She has not had any infections requiring antibiotics since her last visit, and her condition has improved since leaving her previous work environment.   She has been doing well since semi-retiring. She is enjoying having more time at home. Her husband is an ophthalmologist and is working for the [employer]. He is going to retire in the next year or so.   Otherwise, there have been no changes to her past medical history, surgical history, family history, or social  history.    Review of systems otherwise negative other than that mentioned in the HPI.    Objective:   Blood pressure 112/64, pulse 63, temperature 98.1 F (36.7 C), temperature source Temporal, resp. rate 16, height 5' 2 (1.575 m), weight 190 lb 12.8 oz (86.5 kg), SpO2 97%. Body mass index is 34.9 kg/m.    Physical Exam Vitals reviewed.  Constitutional:      Appearance: She is well-developed.     Comments: Delightful.   HENT:     Head: Normocephalic and atraumatic.     Right Ear: Tympanic membrane, ear canal and external ear normal. No drainage, swelling or tenderness. Tympanic membrane is not injected, scarred, erythematous, retracted or bulging.     Left Ear: Tympanic membrane, ear canal and external ear normal. No drainage, swelling or tenderness. Tympanic membrane is not injected, scarred, erythematous, retracted or bulging.     Nose: Rhinorrhea present. No nasal deformity, septal deviation or mucosal edema.     Right Turbinates: Enlarged, swollen and pale.     Left Turbinates: Enlarged, swollen and pale.     Right  Sinus: No maxillary sinus tenderness or frontal sinus tenderness.     Left Sinus: No maxillary sinus tenderness or frontal sinus tenderness.     Comments: Turbinates are erythematous bilaterally.    Mouth/Throat:     Mouth: Mucous membranes are not pale and not dry.     Pharynx: Uvula midline.  Eyes:     General:        Right eye: No discharge.        Left eye: No discharge.     Conjunctiva/sclera: Conjunctivae normal.     Right eye: Right conjunctiva is not injected. No chemosis.    Left eye: Left conjunctiva is not injected. No chemosis.    Pupils: Pupils are equal, round, and reactive to light.  Cardiovascular:     Rate and Rhythm: Normal rate and regular rhythm.     Heart sounds: Normal heart sounds.  Pulmonary:     Effort: Pulmonary effort is normal. No tachypnea, accessory muscle usage or respiratory distress.     Breath sounds: Normal breath  sounds. No wheezing, rhonchi or rales.     Comments: Moving air well in all lung fields. Chest:     Chest wall: No tenderness.  Lymphadenopathy:     Head:     Right side of head: No submandibular, tonsillar or occipital adenopathy.     Left side of head: No submandibular, tonsillar or occipital adenopathy.     Cervical: No cervical adenopathy.  Skin:    General: Skin is warm.     Capillary Refill: Capillary refill takes less than 2 seconds.     Coloration: Skin is not pale.     Findings: No abrasion, erythema, petechiae or rash. Rash is not papular, urticarial or vesicular.  Neurological:     Mental Status: She is alert.  Psychiatric:        Behavior: Behavior is cooperative.      Diagnostic studies:    Spirometry: results normal (FEV1: 1.71/115%, FVC: 2.05/106%, FEV1/FVC: 83%).    Spirometry consistent with normal pattern.    Allergy Studies: none        Marty Shaggy, MD  [facility] 

## 2024-06-29 ENCOUNTER — Encounter: Payer: Self-pay | Admitting: Obstetrics & Gynecology

## 2024-06-29 DIAGNOSIS — M85851 Other specified disorders of bone density and structure, right thigh: Secondary | ICD-10-CM | POA: Diagnosis not present

## 2024-07-15 DIAGNOSIS — M19041 Primary osteoarthritis, right hand: Secondary | ICD-10-CM | POA: Diagnosis not present

## 2024-07-26 DIAGNOSIS — I129 Hypertensive chronic kidney disease with stage 1 through stage 4 chronic kidney disease, or unspecified chronic kidney disease: Secondary | ICD-10-CM | POA: Diagnosis not present

## 2024-07-26 DIAGNOSIS — E1169 Type 2 diabetes mellitus with other specified complication: Secondary | ICD-10-CM | POA: Diagnosis not present

## 2024-07-26 DIAGNOSIS — I10 Essential (primary) hypertension: Secondary | ICD-10-CM | POA: Diagnosis not present

## 2024-07-26 DIAGNOSIS — E1122 Type 2 diabetes mellitus with diabetic chronic kidney disease: Secondary | ICD-10-CM | POA: Diagnosis not present

## 2024-07-26 DIAGNOSIS — N183 Chronic kidney disease, stage 3 unspecified: Secondary | ICD-10-CM | POA: Diagnosis not present

## 2024-07-26 DIAGNOSIS — E1165 Type 2 diabetes mellitus with hyperglycemia: Secondary | ICD-10-CM | POA: Diagnosis not present

## 2024-07-30 ENCOUNTER — Ambulatory Visit: Admitting: Obstetrics & Gynecology

## 2024-07-30 VITALS — BP 112/56 | HR 61 | Ht 61.0 in | Wt 185.1 lb

## 2024-07-30 DIAGNOSIS — N952 Postmenopausal atrophic vaginitis: Secondary | ICD-10-CM

## 2024-07-30 DIAGNOSIS — Z1331 Encounter for screening for depression: Secondary | ICD-10-CM | POA: Diagnosis not present

## 2024-07-30 DIAGNOSIS — Z01419 Encounter for gynecological examination (general) (routine) without abnormal findings: Secondary | ICD-10-CM | POA: Diagnosis not present

## 2024-07-30 MED ORDER — ESTRADIOL 0.1 MG/GM VA CREA: TOPICAL_CREAM | VAGINAL | 12 refills | Status: AC

## 2024-07-30 NOTE — Progress Notes (Signed)
 GYNECOLOGY ANNUAL PREVENTATIVE CARE ENCOUNTER NOTE  History:    Cheryl Good is a 78 y.o. [id number] female here for a routine annual gynecologic exam.  Current complaints: needs refill of her vaginal estradiol  cream.   Denies abnormal vaginal bleeding, discharge, pelvic pain, problems with intercourse or other gynecologic concerns. Of note, patient is a retired Archivist NP.  Gynecologic History No LMP recorded. Patient is postmenopausal. Contraception: post menopausal status Last Pap: 12/29/2019. Result was normal with negative HPV. Does not need further pap smears unless she has new risk factors. Last Mammogram: 12/19/23.  Result was normal Last Colonoscopy: Reported to be up to date and benign per patient. Done outside the system  Obstetric History OB History  Gravida Para Term Preterm AB Living  3 3 3  0 0 3  SAB IAB Ectopic Multiple Live Births  0 0 0  3    # Outcome Date GA Lbr Len/2nd Weight Sex Type Anes PTL Lv  3 Term         LIV  2 Term         LIV  1 Term         LIV    Past Medical History:  Diagnosis Date   Allergy    Arthritis    Asthma    Chicken pox    Complication of anesthesia    laryngospasms with amino esters; sensitive to anesthesia   Diabetes mellitus    Heart murmur    Hypertension    Measles    Mumps    Rubella    Trichomonas    Yeast infection     Past Surgical History:  Procedure Laterality Date   BREAST LUMPECTOMY     left lumpectomy   BUNIONECTOMY Left    CARPAL TUNNEL RELEASE Right 11/06/2017   Procedure: RIGHT CARPAL TUNNEL RELEASE;  Surgeon: Murrell Kuba, MD;  Location: [facility];  Service: Orthopedics;  Laterality: Right;   COLONOSCOPY     orthoscopic knee surgery     TOTAL KNEE ARTHROPLASTY Right 10/14/2016   Procedure: RIGHT TOTAL KNEE ARTHROPLASTY;  Surgeon: Marcey Raman, MD;  Location: [facility];  Service: Orthopedics;  Laterality: Right;   TOTAL KNEE ARTHROPLASTY Left 10/14/2022   Procedure: TOTAL KNEE  ARTHROPLASTY;  Surgeon: Melodi Lerner, MD;  Location: [facility];  Service: Orthopedics;  Laterality: Left;   TRIGGER FINGER RELEASE Right 11/06/2017   Procedure: RIGHT RELEASE TRIGGER FINGER/A-1 PULLEY;  Surgeon: Murrell Kuba, MD;  Location: [facility];  Service: Orthopedics;  Laterality: Right;    Current Outpatient Medications on File Prior to Visit  Medication Sig Dispense Refill   albuterol  (PROVENTIL  HFA;VENTOLIN  HFA) 108 (90 Base) MCG/ACT inhaler Inhale 1-2 puffs into the lungs every 6 (six) hours as needed for wheezing or shortness of breath.     azelastine  (ASTELIN ) 0.1 % nasal spray Place 1 spray into both nostrils 2 (two) times daily as needed for rhinitis. Use in each nostril as directed 60 mL 5   budesonide-glycopyrrolate -formoterol (BREZTRI  AEROSPHERE) 160-9-4.8 MCG/ACT AERO inhaler Inhale 2 puffs into the lungs in the morning and at bedtime. 10.7 g 5   chlorthalidone  (HYGROTON ) 25 MG tablet Take 25 mg by mouth daily.     Cholecalciferol (VITAMIN D3 PO) Take 1 tablet by mouth daily.     ENTRESTO  24-26 MG Take 1 tablet by mouth 2 (two) times daily.     EPINEPHrine  0.3 mg/0.3 mL IJ SOAJ injection Inject 0.3 mg into the muscle as  needed for anaphylaxis.     FARXIGA  10 MG TABS tablet Take 10 mg by mouth daily.     fexofenadine (ALLEGRA) 180 MG tablet Take 180 mg by mouth daily.     fluticasone  (FLONASE ) 50 MCG/ACT nasal spray Place 2 sprays into both nostrils daily.     metFORMIN  (GLUCOPHAGE ) 500 MG tablet Take 500 mg by mouth 2 (two) times daily with a meal.     methocarbamol  (ROBAXIN ) 500 MG tablet Take 1 tablet (500 mg total) by mouth every 6 (six) hours as needed for muscle spasms. 40 tablet 0   montelukast  (SINGULAIR ) 10 MG tablet Take 1 tablet (10 mg total) by mouth daily. 90 tablet 2   olopatadine  (PATADAY ) 0.1 % ophthalmic solution Place 1 drop into both eyes daily.     rosuvastatin  (CRESTOR ) 5 MG tablet Take 1 tablet (5 mg total) by mouth daily. 90 tablet 1    Semaglutide, 2 MG/DOSE, (OZEMPIC, 2 MG/DOSE,) 8 MG/3ML SOPN Inject 2 mg into the skin every Saturday.     tacrolimus  (PROTOPIC ) 0.1 % ointment Use twice daily as needed for eczema flares. OK to use on the face. 100 g 3   amLODipine  (NORVASC ) 5 MG tablet Take 1 tablet (5 mg total) by mouth daily. (Patient not taking: Reported on 07/30/2024) 90 tablet 2   No current facility-administered medications on file prior to visit.    Allergies  Allergen Reactions   Betadine [Povidone Iodine] Anaphylaxis   Bupivacaine  Anaphylaxis   Chloroprocaine Anaphylaxis   Etidocaine Anaphylaxis   Lidocaine  Anaphylaxis    Pt states that she can tolerate lidocaine    Mepivacaine Anaphylaxis   Other Anaphylaxis and Shortness Of Breath    Anesthetics AMINO ESTERS caines   Prilocaine Anaphylaxis   Procaine Anaphylaxis   Tetracaine Anaphylaxis   Gadolinium Derivatives     Other reaction(s): Respiratory distress, Respiratory Distress (ALLERGY/intolerance)   Codeine Nausea And Vomiting   Demerol  Other (See Comments)    Hallucinations     Social History:  reports that she has never smoked. She has never been exposed to tobacco smoke. She has never used smokeless tobacco. She reports that she does not drink alcohol and does not use drugs.  Family History  Problem Relation Age of Onset   Angioedema Mother    Urticaria Mother    Eczema Mother    Hypertension Mother    Diabetes Maternal Grandmother    Cancer Maternal Grandmother     The following portions of the patient's history were reviewed and updated as appropriate: allergies, current medications, past family history, past medical history, past social history, past surgical history and problem list.  Review of Systems Pertinent items noted in HPI and remainder of comprehensive ROS otherwise negative.  Physical Exam:  BP (!) 112/56 (BP Location: Left Arm, Patient Position: Sitting, Cuff Size: Large)   Pulse 61   Ht 5' 1 (1.549 m)   Wt 185 lb 1.9  oz (84 kg)   BMI 34.98 kg/m  CONSTITUTIONAL: Well-developed, well-nourished female in no acute distress.  HENT:  Normocephalic, atraumatic, External right and left ear normal.  EYES: Conjunctivae and EOM are normal. Pupils are equal, round, and reactive to light. No scleral icterus.  NECK: Normal range of motion, supple, no masses observed. SKIN: Skin is warm and dry. No rash noted. Not diaphoretic. No erythema. No pallor. MUSCULOSKELETAL: Normal range of motion. No tenderness.  No cyanosis, clubbing, or edema. NEUROLOGIC: Alert and oriented to person, place, and time. Normal muscle tone  coordination.  PSYCHIATRIC: Normal mood and affect. Normal behavior. Normal judgment and thought content. CARDIOVASCULAR: Normal heart rate noted, regular rhythm RESPIRATORY: Clear to auscultation bilaterally. Effort and breath sounds normal, no problems with respiration noted. BREASTS: Symmetric in size. No masses, tenderness, skin changes, nipple drainage, or lymphadenopathy bilaterally. Performed in the presence of a chaperone. ABDOMEN: Soft, no distention noted.  No tenderness, rebound or guarding.  PELVIC: Normal appearing external genitalia and urethral meatus with moderate atrophy; atrophic vaginal mucosa and cervix.  No abnormal vaginal discharge noted.  Enlarged fibroid uterus noted,, no other palpable masses, no uterine or adnexal tenderness.  Performed in the presence of a chaperone.  Assessment and Plan:     1. Post-menopause atrophic vaginitis Estradiol  cream refilled as requested. - estradiol  (ESTRACE ) 0.1 MG/GM vaginal cream; Insert 0.5 grams 3 times per week.  Dispense: 42.5 g; Refill: 12  2. Well female exam with routine gynecological exam (Primary) Reassuring examination findings today. Mammogram and colon cancer screening are up to date, also up to date with DEXA scan. Routine preventative health maintenance measures emphasized.  Please refer to After Visit Summary for other counseling  recommendations.      GLORIS HUGGER, MD, FACOG Obstetrician & Gynecologist, [facility] for [employer], [facility]

## 2024-08-19 DIAGNOSIS — E119 Type 2 diabetes mellitus without complications: Secondary | ICD-10-CM | POA: Diagnosis not present

## 2024-08-19 DIAGNOSIS — H40013 Open angle with borderline findings, low risk, bilateral: Secondary | ICD-10-CM | POA: Diagnosis not present

## 2024-08-19 DIAGNOSIS — H04123 Dry eye syndrome of bilateral lacrimal glands: Secondary | ICD-10-CM | POA: Diagnosis not present

## 2024-08-19 DIAGNOSIS — Z961 Presence of intraocular lens: Secondary | ICD-10-CM | POA: Diagnosis not present

## 2024-08-19 DIAGNOSIS — H26493 Other secondary cataract, bilateral: Secondary | ICD-10-CM | POA: Diagnosis not present

## 2024-09-06 ENCOUNTER — Telehealth: Payer: Self-pay | Admitting: Allergy & Immunology

## 2024-09-06 MED ORDER — AZELASTINE HCL 0.1 % NA SOLN: 1.0000 | Freq: Two times a day (BID) | NASAL | 5 refills | Status: AC | PRN

## 2024-09-06 MED ORDER — ALBUTEROL SULFATE HFA 108 (90 BASE) MCG/ACT IN AERS: 1.0000 | INHALATION_SPRAY | Freq: Four times a day (QID) | RESPIRATORY_TRACT | 1 refills | Status: AC | PRN

## 2024-09-06 NOTE — Addendum Note (Signed)
 Addended by: MARCINE ISAIAH CROME on: 09/06/2024 04:29 PM   Modules accepted: Orders

## 2024-09-06 NOTE — Telephone Encounter (Signed)
 Spoke with the patient and informed her that medications requested have been sent into [facility] on [address]. Verbalized understanding.

## 2024-09-06 NOTE — Telephone Encounter (Signed)
 Cheryl Good stated she is in need of a refill for albuterol  (PROVENTIL  HFA;VENTOLIN  HFA) 108 (90 Base) MCG/ACT inhaler [[id number]] , and she would like to have 2 of them at a time,one for home and one for travel. She [facility] on  [address] [place]

## 2024-09-06 NOTE — Telephone Encounter (Signed)
 Pt request refill for azelastine  sent to pharmacy

## 2024-09-07 DIAGNOSIS — E1169 Type 2 diabetes mellitus with other specified complication: Secondary | ICD-10-CM | POA: Diagnosis not present

## 2024-09-07 DIAGNOSIS — E1165 Type 2 diabetes mellitus with hyperglycemia: Secondary | ICD-10-CM | POA: Diagnosis not present

## 2024-10-27 ENCOUNTER — Encounter (HOSPITAL_BASED_OUTPATIENT_CLINIC_OR_DEPARTMENT_OTHER): Payer: Self-pay | Admitting: Cardiology

## 2024-12-16 ENCOUNTER — Encounter: Payer: Self-pay | Admitting: Allergy & Immunology

## 2024-12-16 ENCOUNTER — Other Ambulatory Visit: Payer: Self-pay

## 2024-12-16 ENCOUNTER — Ambulatory Visit: Admitting: Allergy & Immunology

## 2024-12-16 VITALS — BP 134/78 | HR 52 | Temp 97.9°F | Resp 14 | Ht 61.0 in | Wt 188.5 lb

## 2024-12-16 DIAGNOSIS — J4530 Mild persistent asthma, uncomplicated: Secondary | ICD-10-CM | POA: Diagnosis not present

## 2024-12-16 DIAGNOSIS — L2089 Other atopic dermatitis: Secondary | ICD-10-CM

## 2024-12-16 DIAGNOSIS — T782XXD Anaphylactic shock, unspecified, subsequent encounter: Secondary | ICD-10-CM

## 2024-12-16 DIAGNOSIS — B999 Unspecified infectious disease: Secondary | ICD-10-CM | POA: Diagnosis not present

## 2024-12-16 DIAGNOSIS — J310 Chronic rhinitis: Secondary | ICD-10-CM | POA: Diagnosis not present

## 2024-12-16 MED ORDER — EPINEPHRINE 0.3 MG/0.3ML IJ SOAJ: 0.3000 mg | INTRAMUSCULAR | 2 refills | Status: AC | PRN

## 2024-12-16 MED ORDER — MONTELUKAST SODIUM 10 MG PO TABS: 10.0000 mg | ORAL_TABLET | Freq: Every day | ORAL | 3 refills | Status: AC

## 2024-12-16 NOTE — Patient Instructions (Addendum)
 1. Mild persistent asthma, uncomplicated - Lung testing looked SO GOOD today. - We are not going to make any changes at this point in time.  - You have the Breztri  to use if needed. - Maybe you can just use this during the spring time.  - Daily controller medication(s): Breztri  two puffs twice daily during certain times of the year when you need it  - Prior to physical activity: albuterol  2 puffs 10-15 minutes before physical activity. - Rescue medications: albuterol  4 puffs every 4-6 hours as needed - Asthma control goals:  * Full participation in all desired activities (may need albuterol  before activity) * Albuterol  use two time or less a week on average (not counting use with activity) * Cough interfering with sleep two time or less a month * Oral steroids no more than once a year * No hospitalizations  2. Chronic non-allergic rhinitis - s/p allergy shots for more than 10 years (two different courses) - Testing was negative via the blood, so clearly the allergy shots helped. - We can do more sensitive skin testing if you are interested in pursuing this.  - In the meantime, continue with the Allegra and the Singulair .  3. Recurrent infections - with protection to only 11/23 strains of Streptococcus pneumonia (despite having received the pneumonia vaccine)  - I think we can hold off on further workup for now, but if the frequency of infections gets worse, we can be more aggressive.  - We can think about doing a prophylactic antibiotic if the frequency of infections increases in 2025.  - This has definitely improved since you have been semi-retired.  4. Eczema - Continue with the tacrolimus  twice daily as needed (SAFE to use on the face).  - OK to use over the counter hydrocortisone on your face if needed.   - Continue to follow with Dr. Shona.   5. 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  - Neffy  is usually not covered by Medicare, but we could try sending it in  if you are interested.   6. Return in about 6 months (around 06/15/2025). You can have the follow up appointment with Dr. Iva or a Nurse Practicioner (our Nurse Practitioners are excellent and always have Physician oversight!).    Please inform us  of any Emergency Department visits, hospitalizations, or changes in symptoms. Call us  before going to the ED for breathing or allergy symptoms since we might be able to fit you in for a sick visit. Feel free to contact us  anytime with any questions, problems, or concerns.  It was a pleasure to see you again today!  Websites that have reliable patient information: 1. American Academy of Asthma, Allergy, and Immunology: www.aaaai.org 2. Food Allergy Research and Education (FARE): foodallergy.org 3. Mothers of Asthmatics: http://www.asthmacommunitynetwork.org 4. American College of Allergy, Asthma, and Immunology: www.acaai.org      Like us  on [employer] and Instagram for our latest updates!      A healthy democracy works best when [employer] participate! Make sure you are registered to vote! If you have moved or changed any of your contact information, you will need to get this updated before voting! Scan the QR codes below to learn more!

## 2024-12-16 NOTE — Progress Notes (Signed)
 "  FOLLOW UP  Date of Service/Encounter:  12/16/24   Assessment:   Mild persistent asthma, uncomplicated   Chronic non-allergic rhinitis    Acute sinusitis - failed amoxicillin (adding cefdinir  instead)   Recurrent infections - with lackluster protection against Streptococcus pneumonia despite pneumonia vaccine in calendar year 2024, consider prophylactic antibiotics if no improvement   Flexural atopic dermatitis - followed by Dermatology (Dr. 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 Mild persistent asthma, uncomplicated - Lung testing looked SO GOOD today. - We are not going to make any changes at this point in time.  - You have the Breztri  to use if needed. - Maybe you can just use this during the spring time.  - Daily controller medication(s): Breztri  two puffs twice daily during certain times of the year when you need it  - Prior to physical activity: albuterol  2 puffs 10-15 minutes before physical activity. - Rescue medications: albuterol  4 puffs every 4-6 hours as needed - Asthma control goals:  * Full participation in all desired activities (may need albuterol  before activity) * Albuterol  use two time or less a week on average (not counting use with activity) * Cough interfering with sleep two time or less a month * Oral steroids no more than once a year * No hospitalizations  2. Chronic non-allergic rhinitis - s/p allergy shots for more than 10 years (two different courses) - Testing was negative via the blood, so clearly the allergy shots helped. - We can do more sensitive skin testing if you are interested in pursuing this.  - In the meantime, continue with the Allegra and the Singulair .  3. Recurrent infections - with protection to only 11/23  strains of Streptococcus pneumonia (despite having received the pneumonia vaccine)  - I think we can hold off on further workup for now, but if the frequency of infections gets worse, we can be more aggressive.  - We can think about doing a prophylactic antibiotic if the frequency of infections increases in 2025.  - This has definitely improved since you have been semi-retired.  4. Eczema - Continue with the tacrolimus  twice daily as needed (SAFE to use on the face).  - OK to use over the counter hydrocortisone on your face if needed.   - Continue to follow with Dr. Shona.   5. 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  - Neffy  is usually not covered by Medicare, but we could try sending it in if you are interested.   6. Return in about 6 months (around 06/15/2025). You can have the follow up appointment with Dr. Iva or a Nurse Practicioner (our Nurse Practitioners are excellent and always have Physician oversight!).     Subjective:   Cheryl Good is a 79 y.o. female presenting today for follow up of  Chief Complaint  Patient presents with   Follow-up   Asthma    She was sick - sinus infection, respiratory and coughing in the beginning of December.     Cheryl Good has a history of the following: Patient Active Problem List   Diagnosis Date Noted   OA (osteoarthritis) of knee 10/14/2022   Osteoarthritis of left knee 10/14/2022   S/P total knee  replacement 10/14/2016   Postcoital bleeding 02/26/2012   Vaginal atrophy 01/28/2012    History obtained from: chart review and patient.  Discussed the use of AI scribe software for clinical note transcription with the patient and/or guardian, who gave verbal consent to proceed.  Cheryl Good is a 79 y.o. female presenting for a follow up visit. She was last seen in July 2025. At that time, her lung testing looked excellent.  She was doing well with Breztri  which she pretty much used as needed.  She also  had albuterol  use as needed.  For her rhinitis, she had negative blood work.  We talked about doing intradermal testing, but she was not interested in pursuing this.  We continue with the Allegra and the Singulair .  For her recurrent infections, she had protection to only 22 out of 23 strains of Streptococcus pneumonia.  We held off on any further workup.  For her eczema, we added on tacrolimus  twice daily as needed.  For her food allergies, she continue to avoid chili peppers.  Since last visit, she has done well.   Asthma/Respiratory Symptom History: She did have a period of illness relatively recently. Her husband - an ophthalmologist - was sick recently for one week. Her husband never covers his mouth. She did have a URI and was treated with antibiotics and prednisone and Robitussin DM. She had to use her inhaler often. She has a history of asthma and uses Breztri  as needed, though she primarily relies on her inhaler, using it once or twice a month, particularly when her house is dusty. She changes air filters every two to three weeks to manage dust exposure.  Allergic Rhinitis Symptom History: Her allergy symptoms include daily nasal congestion and coughing up yellow mucus in the mornings, despite using nasal sprays and antihistamines. She has a history of allergy shots from 2013 to 2022 and previously from 1983 to 1986. She currently takes montelukast  and Allegra daily.  Food Allergy Symptom History: She does have an EpiPen  for a chili pepper allergy.  Infection Symptom History: Her environment has changed since moving from [place], where she used to get sick two to three times a year, to her current location, where she has been sick less frequently. She experienced a recent upper respiratory infection characterized by sinusitis, which required a course of antibiotics and prednisone. During this period, she increased her use of an inhaler. The illness lasted about a week.  She continues to manage the  [employer]. She has an orientation on the second weekend of February. She goes to [place] in Feb and June.   Otherwise, there have been no changes to her past medical history, surgical history, family history, or social history.    Review of systems otherwise negative other than that mentioned in the HPI.    Objective:   Blood pressure 134/78, pulse (!) 52, temperature 97.9 F (36.6 C), temperature source Temporal, resp. rate 14, height 5' 1 (1.549 m), weight 188 lb 8 oz (85.5 kg), SpO2 96%. Body mass index is 35.62 kg/m.    Physical Exam Vitals reviewed.  Constitutional:      Appearance: Normal appearance. She is well-developed and well-groomed. She is not ill-appearing or toxic-appearing.     Comments: Delightful.   HENT:     Head: Normocephalic and atraumatic.     Right Ear: Tympanic membrane, ear canal and external ear normal. No drainage, swelling or tenderness. Tympanic membrane is not injected, scarred, erythematous, retracted or bulging.  Left Ear: Tympanic membrane, ear canal and external ear normal. No drainage, swelling or tenderness. Tympanic membrane is not injected, scarred, erythematous, retracted or bulging.     Nose: Rhinorrhea present. No nasal deformity, septal deviation or mucosal edema.     Right Turbinates: Enlarged, swollen and pale.     Left Turbinates: Enlarged, swollen and pale.     Right Sinus: No maxillary sinus tenderness or frontal sinus tenderness.     Left Sinus: No maxillary sinus tenderness or frontal sinus tenderness.     Comments: Turbinates are erythematous bilaterally.    Mouth/Throat:     Mouth: Mucous membranes are not pale and not dry.     Pharynx: Uvula midline.  Eyes:     General:        Right eye: No discharge.        Left eye: No discharge.     Conjunctiva/sclera: Conjunctivae normal.     Right eye: Right conjunctiva is not injected. No chemosis.    Left eye: Left conjunctiva is not injected. No chemosis.    Pupils:  Pupils are equal, round, and reactive to light.  Cardiovascular:     Rate and Rhythm: Normal rate and regular rhythm.     Heart sounds: Normal heart sounds.  Pulmonary:     Effort: Pulmonary effort is normal. No tachypnea, accessory muscle usage or respiratory distress.     Breath sounds: Normal breath sounds. No wheezing, rhonchi or rales.     Comments: Moving air well in all lung fields. Chest:     Chest wall: No tenderness.  Lymphadenopathy:     Head:     Right side of head: No submandibular, tonsillar or occipital adenopathy.     Left side of head: No submandibular, tonsillar or occipital adenopathy.     Cervical: No cervical adenopathy.  Skin:    General: Skin is warm.     Capillary Refill: Capillary refill takes less than 2 seconds.     Coloration: Skin is not pale.     Findings: No abrasion, erythema, petechiae or rash. Rash is not papular, urticarial or vesicular.  Neurological:     Mental Status: She is alert.  Psychiatric:        Behavior: Behavior is cooperative.      Diagnostic studies:    Spirometry: results normal (FEV1: 1.72/116%, FVC: 2.06/108%, FEV1/FVC: 83%).    Spirometry consistent with normal pattern.   Allergy Studies: none      Marty Shaggy, MD  [facility]        "

## 2024-12-22 ENCOUNTER — Encounter: Payer: Self-pay | Admitting: *Deleted

## 2024-12-22 NOTE — Progress Notes (Signed)
 Cheryl Good                                          MRN: [id number]   12/22/2024   The [facility] Quality Team Specialist reviewed this patient medical record for the purposes of chart review for care gap closure. The following were reviewed: chart review for care gap closure-kidney health evaluation for diabetes:eGFR  and uACR.    [facility] Quality Team

## 2024-12-30 ENCOUNTER — Ambulatory Visit (HOSPITAL_BASED_OUTPATIENT_CLINIC_OR_DEPARTMENT_OTHER): Admitting: Cardiology

## 2025-03-01 ENCOUNTER — Ambulatory Visit: Admitting: Dermatology

## 2025-06-16 ENCOUNTER — Ambulatory Visit: Admitting: Allergy & Immunology
